# Patient Record
Sex: Male | Born: 1945 | Race: White | Hispanic: No | Smoking: Former smoker
Health system: Southern US, Community
[De-identification: ages and names within clinical notes are randomized; demographics above are authoritative.]

## PROBLEM LIST (undated history)

## (undated) DIAGNOSIS — K635 Polyp of colon: Secondary | ICD-10-CM

## (undated) DIAGNOSIS — J309 Allergic rhinitis, unspecified: Secondary | ICD-10-CM

## (undated) DIAGNOSIS — D126 Benign neoplasm of colon, unspecified: Secondary | ICD-10-CM

## (undated) DIAGNOSIS — C61 Malignant neoplasm of prostate: Secondary | ICD-10-CM

## (undated) DIAGNOSIS — IMO0002 Reserved for concepts with insufficient information to code with codable children: Secondary | ICD-10-CM

## (undated) DIAGNOSIS — K648 Other hemorrhoids: Secondary | ICD-10-CM

## (undated) DIAGNOSIS — E7800 Pure hypercholesterolemia, unspecified: Secondary | ICD-10-CM

## (undated) DIAGNOSIS — H269 Unspecified cataract: Secondary | ICD-10-CM

## (undated) DIAGNOSIS — Z85828 Personal history of other malignant neoplasm of skin: Secondary | ICD-10-CM

## (undated) DIAGNOSIS — Z8619 Personal history of other infectious and parasitic diseases: Secondary | ICD-10-CM

## (undated) DIAGNOSIS — K219 Gastro-esophageal reflux disease without esophagitis: Secondary | ICD-10-CM

## (undated) DIAGNOSIS — T7840XA Allergy, unspecified, initial encounter: Secondary | ICD-10-CM

## (undated) DIAGNOSIS — N189 Chronic kidney disease, unspecified: Secondary | ICD-10-CM

## (undated) DIAGNOSIS — K649 Unspecified hemorrhoids: Secondary | ICD-10-CM

## (undated) DIAGNOSIS — I10 Essential (primary) hypertension: Secondary | ICD-10-CM

## (undated) HISTORY — DX: Allergy, unspecified, initial encounter: T78.40XA

## (undated) HISTORY — DX: Unspecified hemorrhoids: K64.9

## (undated) HISTORY — DX: Chronic kidney disease, unspecified: N18.9

## (undated) HISTORY — DX: Pure hypercholesterolemia, unspecified: E78.00

## (undated) HISTORY — DX: Other hemorrhoids: K64.8

## (undated) HISTORY — DX: Reserved for concepts with insufficient information to code with codable children: IMO0002

## (undated) HISTORY — DX: Malignant neoplasm of prostate: C61

## (undated) HISTORY — PX: OTHER SURGICAL HISTORY: SHX169

## (undated) HISTORY — DX: Personal history of other malignant neoplasm of skin: Z85.828

## (undated) HISTORY — DX: Polyp of colon: K63.5

## (undated) HISTORY — DX: Allergic rhinitis, unspecified: J30.9

## (undated) HISTORY — DX: Personal history of other infectious and parasitic diseases: Z86.19

## (undated) HISTORY — DX: Gastro-esophageal reflux disease without esophagitis: K21.9

## (undated) HISTORY — PX: PROSTATE SURGERY: SHX751

## (undated) HISTORY — DX: Benign neoplasm of colon, unspecified: D12.6

## (undated) HISTORY — DX: Unspecified cataract: H26.9

## (undated) HISTORY — DX: Essential (primary) hypertension: I10

## (undated) HISTORY — PX: CATARACT EXTRACTION: SUR2

## (undated) HISTORY — PX: APPENDECTOMY: SHX54

## (undated) HISTORY — PX: SMALL INTESTINE SURGERY: SHX150

---

## 1998-11-28 ENCOUNTER — Ambulatory Visit (HOSPITAL_COMMUNITY): Admission: RE | Admit: 1998-11-28 | Discharge: 1998-11-28 | Payer: Self-pay | Admitting: Orthopedic Surgery

## 1998-11-28 ENCOUNTER — Encounter: Payer: Self-pay | Admitting: Orthopedic Surgery

## 1998-12-04 ENCOUNTER — Encounter: Payer: Self-pay | Admitting: Orthopedic Surgery

## 1998-12-04 ENCOUNTER — Ambulatory Visit (HOSPITAL_COMMUNITY): Admission: RE | Admit: 1998-12-04 | Discharge: 1998-12-04 | Payer: Self-pay | Admitting: Orthopedic Surgery

## 1999-09-17 ENCOUNTER — Encounter: Payer: Self-pay | Admitting: Family Medicine

## 1999-09-17 ENCOUNTER — Ambulatory Visit (HOSPITAL_COMMUNITY): Admission: RE | Admit: 1999-09-17 | Discharge: 1999-09-17 | Payer: Self-pay | Admitting: Family Medicine

## 1999-09-22 ENCOUNTER — Encounter: Admission: RE | Admit: 1999-09-22 | Discharge: 1999-09-22 | Payer: Self-pay | Admitting: Family Medicine

## 1999-09-22 ENCOUNTER — Encounter: Payer: Self-pay | Admitting: Family Medicine

## 1999-10-23 ENCOUNTER — Encounter: Payer: Self-pay | Admitting: Neurosurgery

## 1999-10-28 ENCOUNTER — Ambulatory Visit (HOSPITAL_COMMUNITY): Admission: RE | Admit: 1999-10-28 | Discharge: 1999-10-29 | Payer: Self-pay | Admitting: Neurosurgery

## 1999-10-28 ENCOUNTER — Encounter: Payer: Self-pay | Admitting: Neurosurgery

## 1999-12-15 ENCOUNTER — Ambulatory Visit (HOSPITAL_COMMUNITY): Admission: RE | Admit: 1999-12-15 | Discharge: 1999-12-15 | Payer: Self-pay | Admitting: Neurosurgery

## 1999-12-15 ENCOUNTER — Encounter: Payer: Self-pay | Admitting: Neurosurgery

## 2000-02-23 ENCOUNTER — Encounter: Payer: Self-pay | Admitting: Family Medicine

## 2000-02-23 ENCOUNTER — Ambulatory Visit (HOSPITAL_COMMUNITY): Admission: RE | Admit: 2000-02-23 | Discharge: 2000-02-23 | Payer: Self-pay | Admitting: Family Medicine

## 2001-02-09 HISTORY — PX: OTHER SURGICAL HISTORY: SHX169

## 2001-12-19 ENCOUNTER — Ambulatory Visit (HOSPITAL_COMMUNITY): Admission: RE | Admit: 2001-12-19 | Discharge: 2001-12-19 | Payer: Self-pay | Admitting: *Deleted

## 2002-12-13 ENCOUNTER — Emergency Department (HOSPITAL_COMMUNITY): Admission: EM | Admit: 2002-12-13 | Discharge: 2002-12-13 | Payer: Self-pay | Admitting: Emergency Medicine

## 2004-03-11 ENCOUNTER — Ambulatory Visit (HOSPITAL_COMMUNITY): Admission: RE | Admit: 2004-03-11 | Discharge: 2004-03-11 | Payer: Self-pay | Admitting: Family Medicine

## 2004-03-11 IMAGING — CR DG CHEST 2V
2 series · 2 of 2 positions shown · non-contrast
Comparison: none

CLINICAL DATA: Chronic cough for three weeks.
 TWO VIEW CHEST: 
 PA and lateral views of the chest are made and are compared to the previous studies of [DATE] and again show the small 1 mm calcified granuloma in the left upper lung field.  There is mild diffuse peribronchial thickening but no evidence of definite acute infiltrate, consolidation, pleural effusion, or pneumothorax.  There has been an anterior fusion of the lower cervical spine.  The heart and mediastinum are normal.  The bony thorax is within the limits of normal.

[view not recorded (1 of 2)]
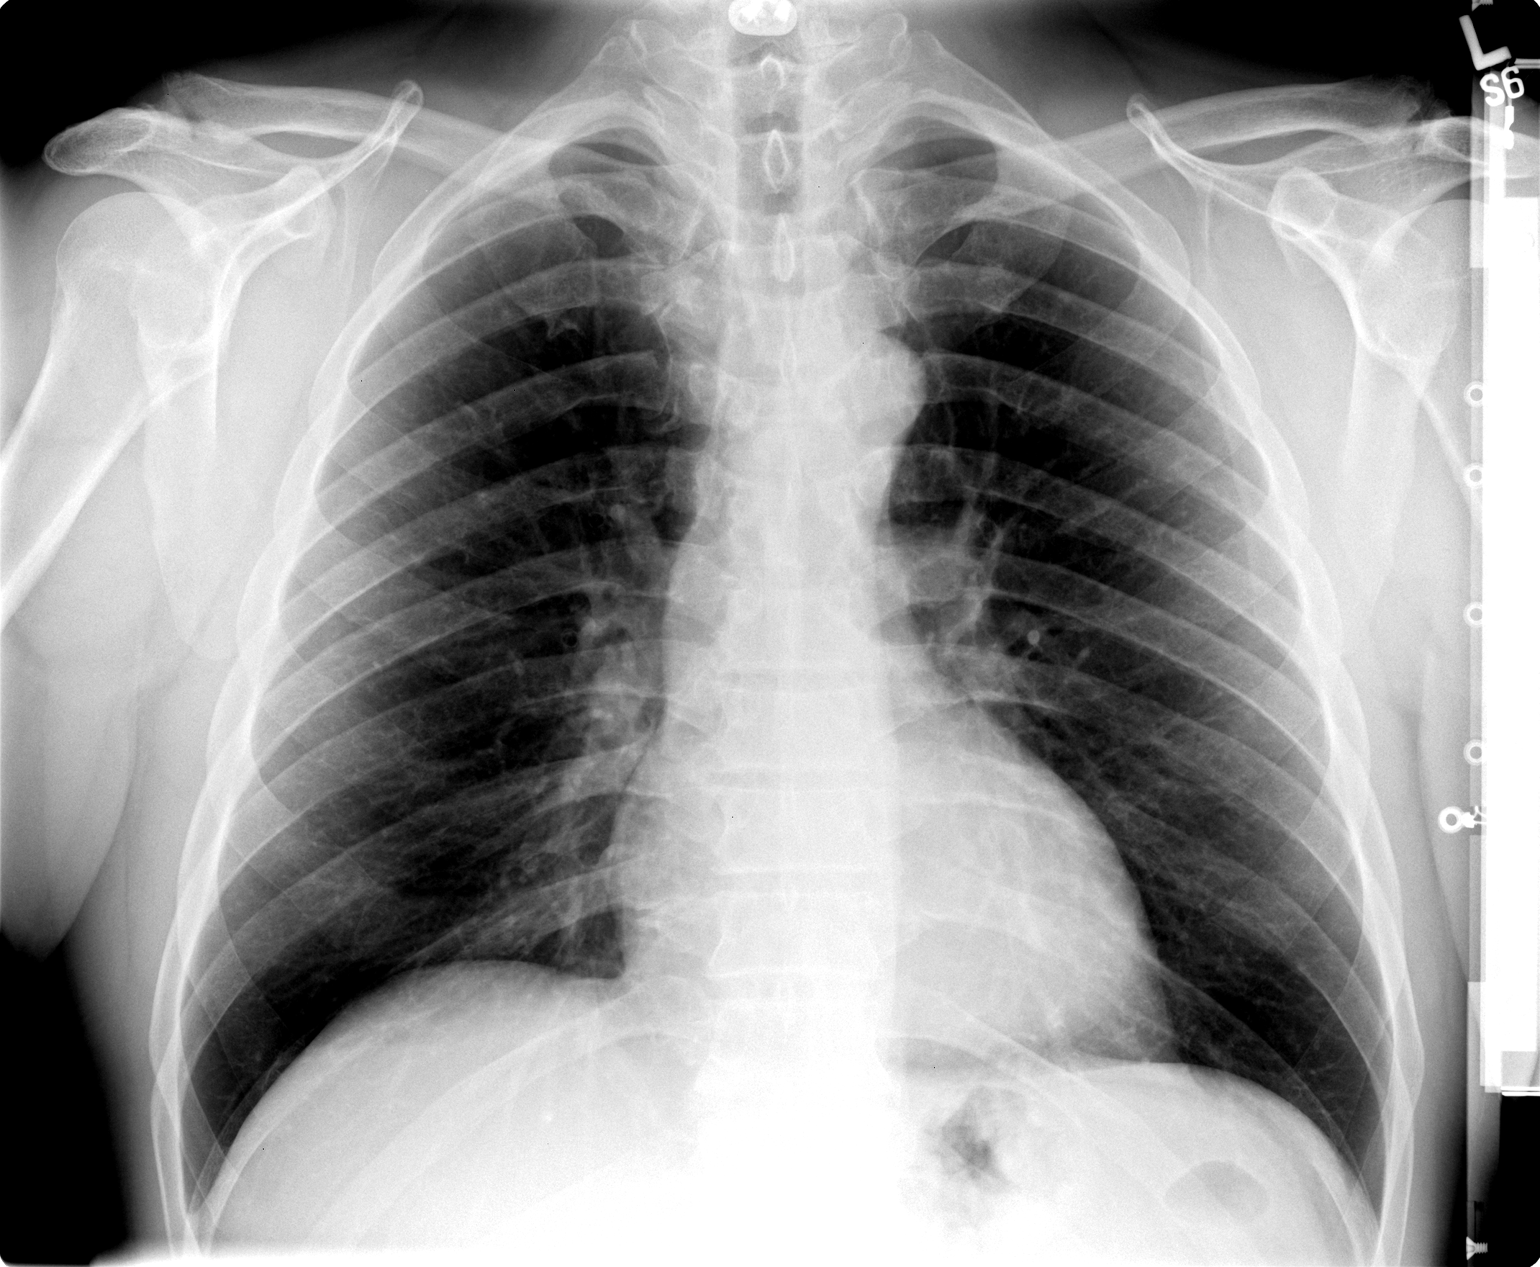

[view not recorded (2 of 2)]
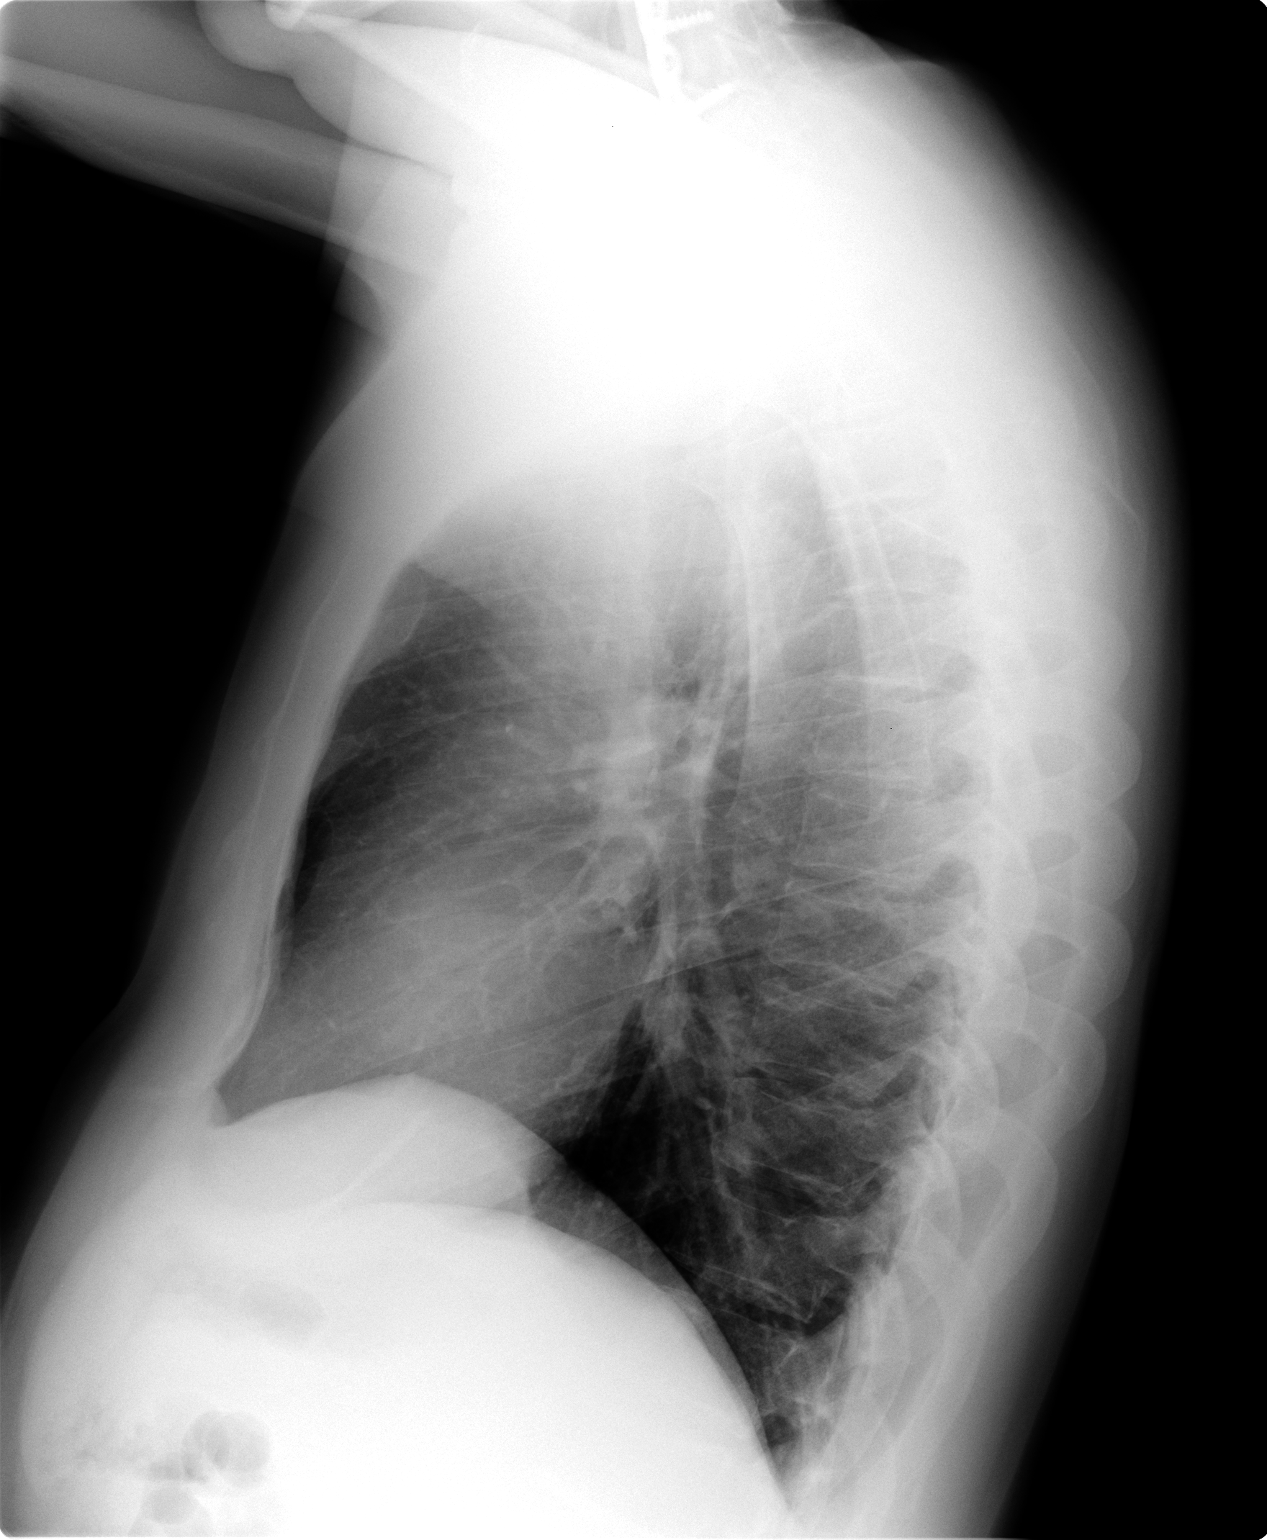

[2 of 2 positions shown; findings below may reference images not displayed]

IMPRESSION: Mild diffuse peribronchial thickening compatible with a mild bronchitis.  
 Stable 1 mm calcified granuloma in the left upper lobe.

## 2007-04-29 IMAGING — CR DG HAND*L* EXAM
1 series · 3 of 3 positions shown · non-contrast
Comparison: NONE

CLINICAL DATA: Pain and swelling. 

LEFT HAND

[Series 1: view not recorded · 0.17mm/px · 3 of 3 slices shown]
[im 1/3]
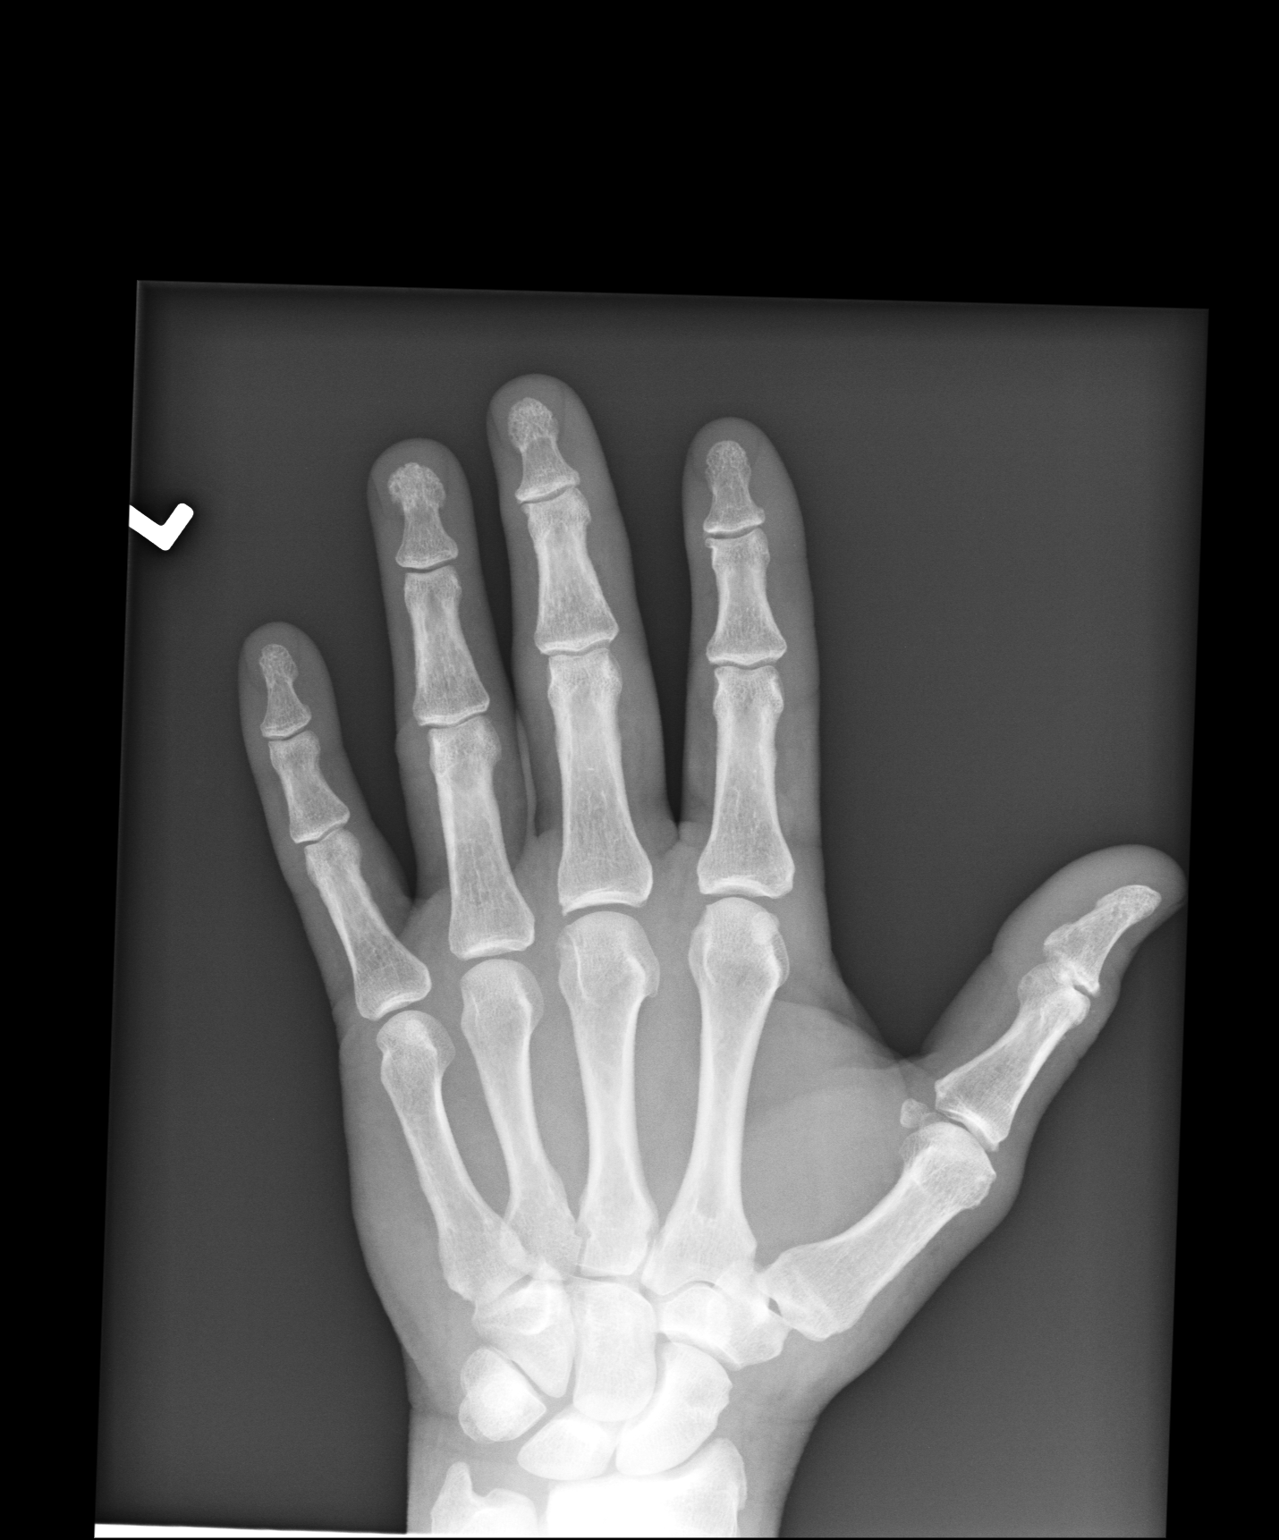
[im 2/3]
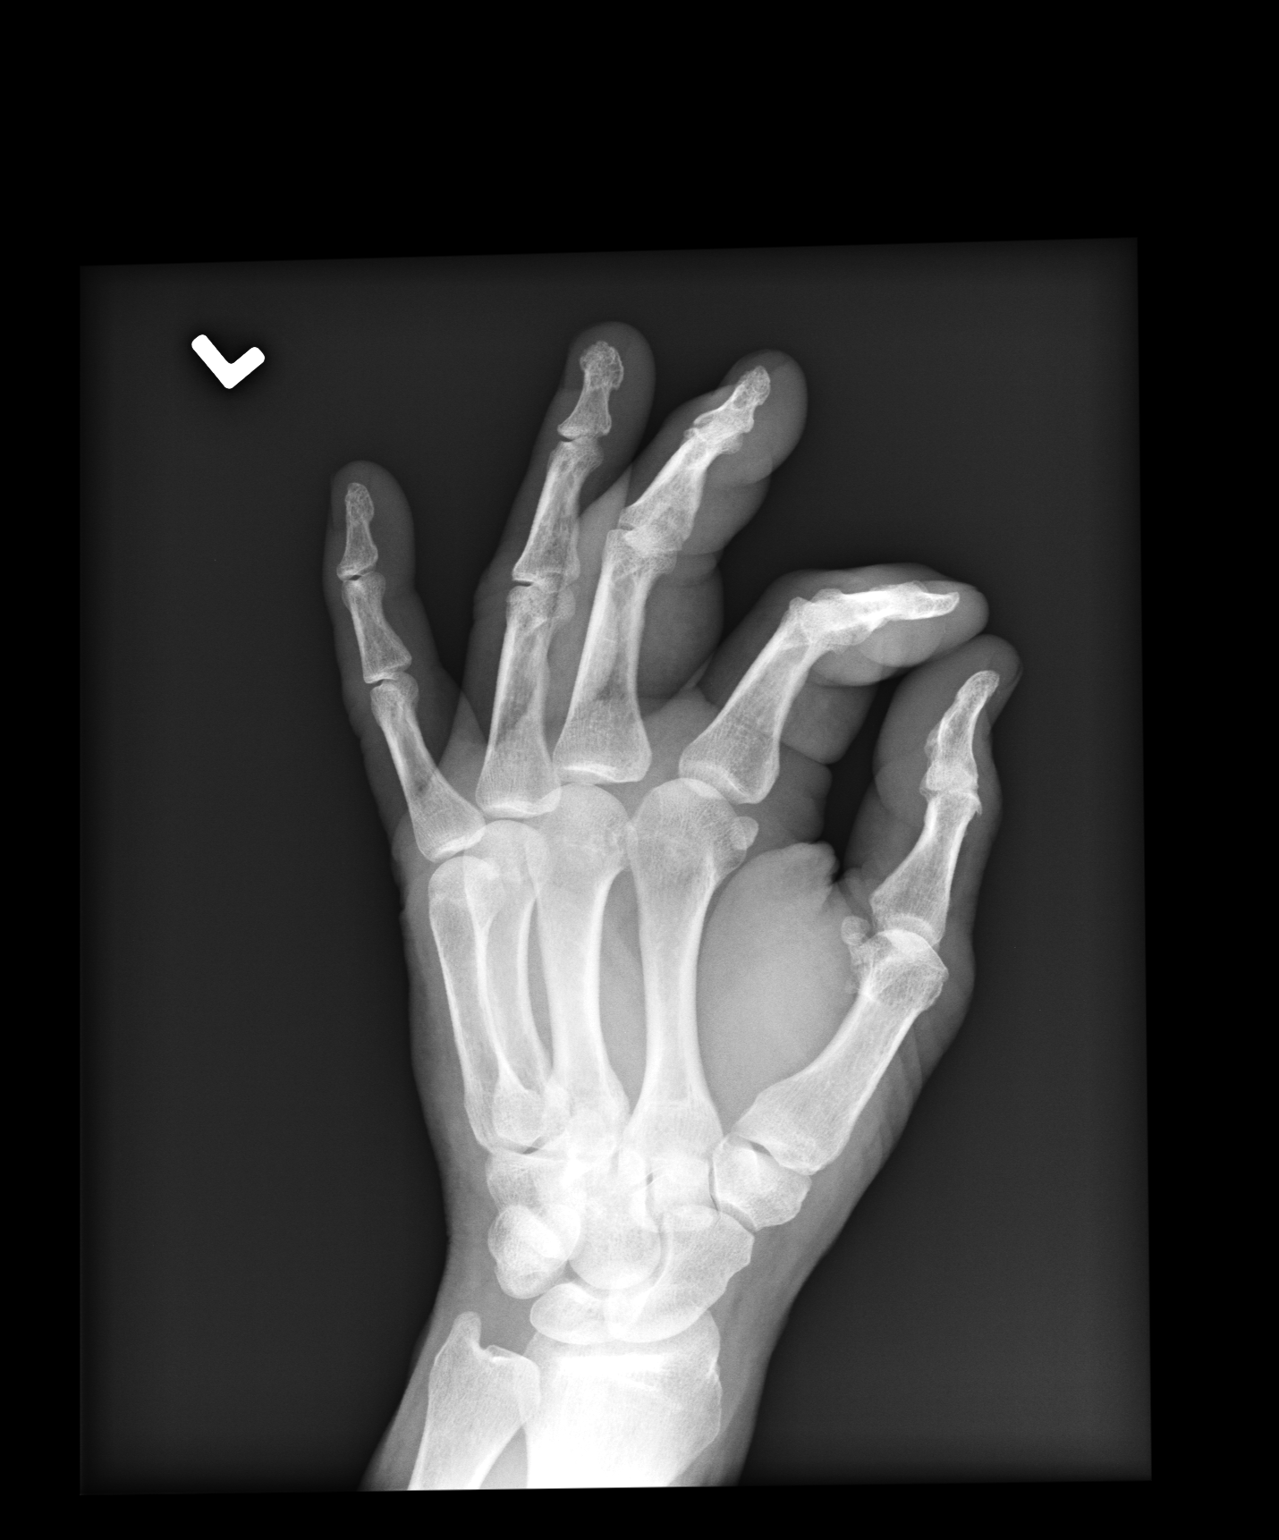
[im 3/3]
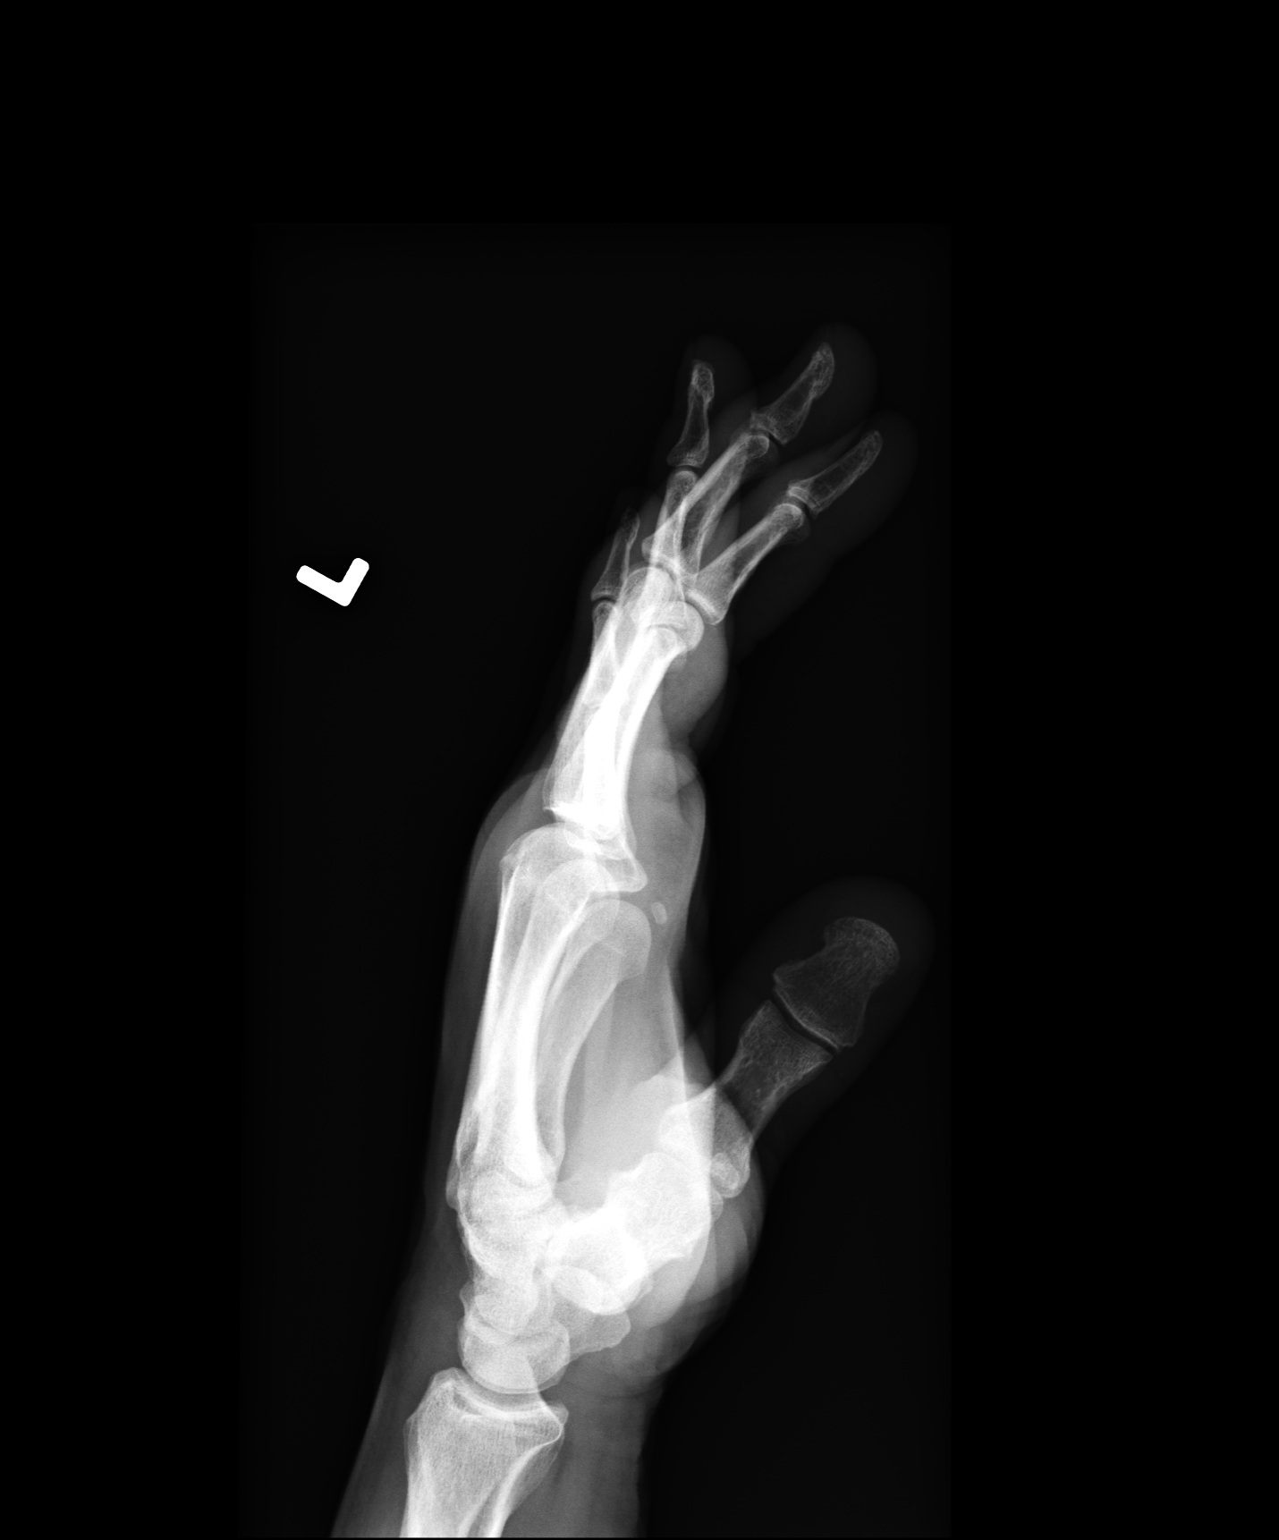

[3 of 3 positions shown; findings below may reference images not displayed]

FINDINGS: Views of the left hand demonstrate no evidence of 
fracture, dislocation, soft tissue abnormality or changes 
suggesting erosive or degenerative arthritis. 

reviewed on [DATE] Dict Date: [DATE]  Tran Date:  
[DATE] DAS  [REDACTED]

## 2011-02-16 DIAGNOSIS — C61 Malignant neoplasm of prostate: Secondary | ICD-10-CM | POA: Diagnosis not present

## 2011-02-17 DIAGNOSIS — M6281 Muscle weakness (generalized): Secondary | ICD-10-CM | POA: Diagnosis not present

## 2011-02-17 DIAGNOSIS — N393 Stress incontinence (female) (male): Secondary | ICD-10-CM | POA: Diagnosis not present

## 2011-02-17 DIAGNOSIS — C61 Malignant neoplasm of prostate: Secondary | ICD-10-CM | POA: Diagnosis not present

## 2011-05-18 DIAGNOSIS — E782 Mixed hyperlipidemia: Secondary | ICD-10-CM | POA: Diagnosis not present

## 2011-05-18 DIAGNOSIS — J309 Allergic rhinitis, unspecified: Secondary | ICD-10-CM | POA: Diagnosis not present

## 2011-05-18 DIAGNOSIS — C61 Malignant neoplasm of prostate: Secondary | ICD-10-CM | POA: Diagnosis not present

## 2011-05-18 DIAGNOSIS — I10 Essential (primary) hypertension: Secondary | ICD-10-CM | POA: Diagnosis not present

## 2011-05-25 DIAGNOSIS — C61 Malignant neoplasm of prostate: Secondary | ICD-10-CM | POA: Diagnosis not present

## 2011-05-25 DIAGNOSIS — N529 Male erectile dysfunction, unspecified: Secondary | ICD-10-CM | POA: Diagnosis not present

## 2011-05-26 DIAGNOSIS — E782 Mixed hyperlipidemia: Secondary | ICD-10-CM | POA: Diagnosis not present

## 2011-05-26 DIAGNOSIS — C61 Malignant neoplasm of prostate: Secondary | ICD-10-CM | POA: Diagnosis not present

## 2011-05-26 DIAGNOSIS — I10 Essential (primary) hypertension: Secondary | ICD-10-CM | POA: Diagnosis not present

## 2011-05-26 DIAGNOSIS — K219 Gastro-esophageal reflux disease without esophagitis: Secondary | ICD-10-CM | POA: Diagnosis not present

## 2011-05-26 DIAGNOSIS — J309 Allergic rhinitis, unspecified: Secondary | ICD-10-CM | POA: Diagnosis not present

## 2011-05-26 DIAGNOSIS — R0789 Other chest pain: Secondary | ICD-10-CM | POA: Diagnosis not present

## 2011-06-08 DIAGNOSIS — K219 Gastro-esophageal reflux disease without esophagitis: Secondary | ICD-10-CM | POA: Diagnosis not present

## 2011-06-08 DIAGNOSIS — R079 Chest pain, unspecified: Secondary | ICD-10-CM | POA: Diagnosis not present

## 2011-06-08 DIAGNOSIS — I10 Essential (primary) hypertension: Secondary | ICD-10-CM | POA: Diagnosis not present

## 2011-06-22 DIAGNOSIS — E782 Mixed hyperlipidemia: Secondary | ICD-10-CM | POA: Diagnosis not present

## 2011-06-22 DIAGNOSIS — R079 Chest pain, unspecified: Secondary | ICD-10-CM | POA: Diagnosis not present

## 2011-06-22 DIAGNOSIS — I10 Essential (primary) hypertension: Secondary | ICD-10-CM | POA: Diagnosis not present

## 2011-06-26 DIAGNOSIS — I10 Essential (primary) hypertension: Secondary | ICD-10-CM | POA: Diagnosis not present

## 2011-06-26 DIAGNOSIS — R079 Chest pain, unspecified: Secondary | ICD-10-CM | POA: Diagnosis not present

## 2011-06-26 DIAGNOSIS — E782 Mixed hyperlipidemia: Secondary | ICD-10-CM | POA: Diagnosis not present

## 2011-07-17 DIAGNOSIS — C61 Malignant neoplasm of prostate: Secondary | ICD-10-CM | POA: Diagnosis not present

## 2011-07-17 DIAGNOSIS — G4700 Insomnia, unspecified: Secondary | ICD-10-CM | POA: Diagnosis not present

## 2011-07-17 DIAGNOSIS — I10 Essential (primary) hypertension: Secondary | ICD-10-CM | POA: Diagnosis not present

## 2011-07-17 DIAGNOSIS — K219 Gastro-esophageal reflux disease without esophagitis: Secondary | ICD-10-CM | POA: Diagnosis not present

## 2011-07-17 DIAGNOSIS — J309 Allergic rhinitis, unspecified: Secondary | ICD-10-CM | POA: Diagnosis not present

## 2011-07-17 DIAGNOSIS — E782 Mixed hyperlipidemia: Secondary | ICD-10-CM | POA: Diagnosis not present

## 2011-08-24 DIAGNOSIS — Z125 Encounter for screening for malignant neoplasm of prostate: Secondary | ICD-10-CM | POA: Diagnosis not present

## 2011-08-24 DIAGNOSIS — Z79899 Other long term (current) drug therapy: Secondary | ICD-10-CM | POA: Diagnosis not present

## 2011-08-24 DIAGNOSIS — Z8546 Personal history of malignant neoplasm of prostate: Secondary | ICD-10-CM | POA: Diagnosis not present

## 2011-11-17 DIAGNOSIS — E782 Mixed hyperlipidemia: Secondary | ICD-10-CM | POA: Diagnosis not present

## 2011-11-17 DIAGNOSIS — I10 Essential (primary) hypertension: Secondary | ICD-10-CM | POA: Diagnosis not present

## 2011-11-17 DIAGNOSIS — J309 Allergic rhinitis, unspecified: Secondary | ICD-10-CM | POA: Diagnosis not present

## 2011-11-17 DIAGNOSIS — K219 Gastro-esophageal reflux disease without esophagitis: Secondary | ICD-10-CM | POA: Diagnosis not present

## 2011-11-17 DIAGNOSIS — G4700 Insomnia, unspecified: Secondary | ICD-10-CM | POA: Diagnosis not present

## 2011-11-17 DIAGNOSIS — C61 Malignant neoplasm of prostate: Secondary | ICD-10-CM | POA: Diagnosis not present

## 2011-11-17 DIAGNOSIS — Z23 Encounter for immunization: Secondary | ICD-10-CM | POA: Diagnosis not present

## 2011-11-23 DIAGNOSIS — C61 Malignant neoplasm of prostate: Secondary | ICD-10-CM | POA: Diagnosis not present

## 2011-11-25 ENCOUNTER — Telehealth: Payer: Self-pay | Admitting: Internal Medicine

## 2011-11-25 NOTE — Telephone Encounter (Signed)
Forward 73 pages from [facility] to Dr. Erick Blinks for review on 11-25-11 ym

## 2011-11-26 ENCOUNTER — Encounter: Payer: Self-pay | Admitting: Internal Medicine

## 2011-12-21 ENCOUNTER — Encounter: Payer: Self-pay | Admitting: Internal Medicine

## 2011-12-22 ENCOUNTER — Ambulatory Visit (INDEPENDENT_AMBULATORY_CARE_PROVIDER_SITE_OTHER): Payer: Medicare Other | Admitting: Internal Medicine

## 2011-12-22 ENCOUNTER — Encounter: Payer: Self-pay | Admitting: Internal Medicine

## 2011-12-22 ENCOUNTER — Ambulatory Visit: Payer: Self-pay | Admitting: Internal Medicine

## 2011-12-22 VITALS — BP 144/90 | HR 76 | Ht 68.5 in | Wt 200.8 lb

## 2011-12-22 DIAGNOSIS — K219 Gastro-esophageal reflux disease without esophagitis: Secondary | ICD-10-CM | POA: Diagnosis not present

## 2011-12-22 DIAGNOSIS — K648 Other hemorrhoids: Secondary | ICD-10-CM

## 2011-12-22 DIAGNOSIS — K3189 Other diseases of stomach and duodenum: Secondary | ICD-10-CM

## 2011-12-22 DIAGNOSIS — C61 Malignant neoplasm of prostate: Secondary | ICD-10-CM | POA: Insufficient documentation

## 2011-12-22 DIAGNOSIS — I10 Essential (primary) hypertension: Secondary | ICD-10-CM | POA: Insufficient documentation

## 2011-12-22 DIAGNOSIS — R140 Abdominal distension (gaseous): Secondary | ICD-10-CM

## 2011-12-22 DIAGNOSIS — R141 Gas pain: Secondary | ICD-10-CM | POA: Diagnosis not present

## 2011-12-22 DIAGNOSIS — Z86010 Personal history of colonic polyps: Secondary | ICD-10-CM | POA: Insufficient documentation

## 2011-12-22 DIAGNOSIS — R143 Flatulence: Secondary | ICD-10-CM | POA: Diagnosis not present

## 2011-12-22 DIAGNOSIS — R1013 Epigastric pain: Secondary | ICD-10-CM

## 2011-12-22 MED ORDER — ALIGN PO CAPS: 1.0000 | ORAL_CAPSULE | Freq: Every day | ORAL | Status: DC

## 2011-12-22 MED ORDER — HYDROCORTISONE ACETATE 25 MG RE SUPP: 25.0000 mg | Freq: Two times a day (BID) | RECTAL | Status: DC

## 2011-12-22 NOTE — Progress Notes (Signed)
Patient ID: Kurt Hall, male   DOB: 05-Mar-1945, 66 y.o.   MRN: [id number]  SUBJECTIVE: HPI Kurt Hall is a 66 yo male with past no history of hypertension, hypercholesterolemia, adenomatous colon polyps, prostate cancer followed by [facility] urology, and small bowel obstruction status post lysis of adhesions who seen in consultation at the request of Dr. Azucena Cecil for evaluation of indigestion and abdominal bloating. The patient reports long-standing issues with heartburn and indigestion. He has been on PPI therapy for some time, and several months ago was switched to Dexilant therapy. He is taking Dexilant 60 mg 30 minutes before breakfast. This has relieved his heartburn but he continues to have indigestion and mild nausea after eating. He denies vomiting. He denies early satiety. No dysphagia or odynophagia. He did report a history of constipation, but this resolved after his abdominal surgery in may 2012. He reports in may of 2012 while traveling in [place] he developed abdominal pain and a bowel obstruction. He was taken to surgery and had lysis of adhesions. Since this time his constipation has not been an issue. He is having regular, formed brown bowel movements 1-2 times daily. No blood in his stool or melena. He also reports 7-8 months of abdominal bloating which tends to be worse at night. He has tried a very his diet without relief. He also has tried Gas-X which seems to help some but not completely. The bloating is relieved with passage of lower gas, but he denies frequent belching.  No fevers, chills.  Dr. Bosie Clos performed the patient's colonoscopy on 05/08/2010. This exam was to the cecum with a good preparation. Findings 3 semisolid so polyps in the rectosigmoid colon and distal sigmoid colon. Polyps removed with cold forceps. Internal hemorrhoids. Pathology =  tubular adenoma, separate benign polypoid colorectal mucosa with lymphoid aggregate, separate fragments of benign polypoid colorectal mucosa.  High-grade dysplasia is not identified  Review of Systems  As per history of present illness, otherwise negative   Past Medical History  Diagnosis Date  . Hypertension   . Hypercholesterolemia   . Allergic rhinitis   . Hx of herpes genitalis   . Hx of basal cell carcinoma   . Dactylitis   . Colon polyps   . Prostate cancer     followed by [facility]    Current Outpatient Prescriptions  Medication Sig Dispense Refill  . amLODipine (NORVASC) 5 MG tablet Take 5 mg by mouth daily.      Marland Kitchen aspirin 81 MG tablet Take 81 mg by mouth daily.      Marland Kitchen atorvastatin (LIPITOR) 20 MG tablet Take 20 mg by mouth daily.      . clonazePAM (KLONOPIN) 0.5 MG tablet Take 0.5 mg by mouth 2 (two) times daily as needed.      Marland Kitchen dexlansoprazole (DEXILANT) 60 MG capsule Take 60 mg by mouth daily.      . fluticasone (VERAMYST) 27.5 MCG/SPRAY nasal spray Place 2 sprays into the nose daily.      . folic acid (FOLVITE) 400 MCG tablet Take 400 mcg by mouth daily.      Marland Kitchen losartan-hydrochlorothiazide (HYZAAR) 100-12.5 MG per tablet Take 1 tablet by mouth daily.      . Multiple Vitamins-Minerals (MULTIVITAMIN PO) Take by mouth daily.      . bifidobacterium infantis (ALIGN) capsule Take 1 capsule by mouth daily.  14 capsule  0  . hydrocortisone (ANUSOL-HC) 25 MG suppository Place 1 suppository (25 mg total) rectally 2 (two) times daily.  12  suppository  0    No Known Allergies  Family History  Problem Relation Age of Onset  . Colon cancer Mother     History  Substance Use Topics  . Smoking status: Former Smoker    Types: Cigarettes  . Smokeless tobacco: Never Used  . Alcohol Use: Yes    OBJECTIVE: BP 144/90  Pulse 76  Ht 5' 8.5" (1.74 m)  Wt 200 lb 12.8 oz (91.082 kg)  BMI 30.09 kg/m2 Constitutional: Well-developed and well-nourished. No distress. HEENT: Normocephalic and atraumatic. Oropharynx is clear and moist. No oropharyngeal exudate. Conjunctivae are normal. No scleral icterus. Neck: Neck  supple. Trachea midline. Cardiovascular: Normal rate, regular rhythm and intact distal pulses. No M/R/G Pulmonary/chest: Effort normal and breath sounds normal. No wheezing, rales or rhonchi. Abdominal: Soft, nontender, nondistended. Bowel sounds active throughout. Well-healed midline abdominal scar There are no masses palpable. No hepatosplenomegaly. Extremities: no clubbing, cyanosis, or edema Lymphadenopathy: No cervical adenopathy noted. Neurological: Alert and oriented to person place and time. Skin: Skin is warm and dry. No rashes noted. Psychiatric: Normal mood and affect. Behavior is normal.   ASSESSMENT AND PLAN: 66 yo male with past no history of hypertension, hypercholesterolemia, adenomatous colon polyps, prostate cancer followed by [facility] urology, and small bowel obstruction status post lysis of adhesions who seen in consultation at the request of Dr. Azucena Cecil for evaluation of indigestion and abdominal bloating  1.  Indigestion/bloating -- given the patient's ongoing dyspeptic symptoms despite daily PPI therapy, I recommended direct visualization with upper endoscopy. I also will give him a trial Align one capsule daily as a probiotic to try to help regulate her digestion and help with bloating. He is given a diet sheet on low bloating foods.  Small intestinal bacterial overgrowth is in the differential, especially given his prior bowel surgery. If his upper endoscopy is unremarkable, I will consider a trial of a nonabsorbable antibiotic such as rifaximin for empiric treatment.  I will not changed his PPI therapy until after endoscopy based on results.  2.  Hemorrhoids -- intermittently an issue and seen on colonoscopy in 2012. I will give him a prescription for hydrocortisone suppositories to be used twice daily for 5 days as needed  3.  CRC screening/history of tubular adenoma -- I recommend repeat colonoscopy in 2017 based on the findings/pathology report of one tubular adenoma (the  other 2 polyps were not adenomatous tissue)

## 2011-12-22 NOTE — Patient Instructions (Addendum)
You have been scheduled for an endoscopy with propofol. Please follow written instructions given to you at your visit today. If you use inhalers (even only as needed) or a CPAP machine, please bring them with you on the day of your procedure.  You have been given samples of Align, please take 1 capsule daily.

## 2011-12-28 DIAGNOSIS — H04129 Dry eye syndrome of unspecified lacrimal gland: Secondary | ICD-10-CM | POA: Diagnosis not present

## 2011-12-28 DIAGNOSIS — H2510 Age-related nuclear cataract, unspecified eye: Secondary | ICD-10-CM | POA: Diagnosis not present

## 2011-12-30 ENCOUNTER — Ambulatory Visit (AMBULATORY_SURGERY_CENTER): Payer: Medicare Other | Admitting: Internal Medicine

## 2011-12-30 ENCOUNTER — Encounter: Payer: Self-pay | Admitting: Internal Medicine

## 2011-12-30 VITALS — BP 126/70 | HR 67 | Temp 97.4°F | Resp 20 | Ht 68.0 in | Wt 200.0 lb

## 2011-12-30 DIAGNOSIS — R1013 Epigastric pain: Secondary | ICD-10-CM | POA: Diagnosis not present

## 2011-12-30 DIAGNOSIS — K3189 Other diseases of stomach and duodenum: Secondary | ICD-10-CM | POA: Diagnosis not present

## 2011-12-30 DIAGNOSIS — K219 Gastro-esophageal reflux disease without esophagitis: Secondary | ICD-10-CM | POA: Diagnosis not present

## 2011-12-30 DIAGNOSIS — K317 Polyp of stomach and duodenum: Secondary | ICD-10-CM

## 2011-12-30 DIAGNOSIS — K319 Disease of stomach and duodenum, unspecified: Secondary | ICD-10-CM

## 2011-12-30 DIAGNOSIS — R143 Flatulence: Secondary | ICD-10-CM | POA: Diagnosis not present

## 2011-12-30 DIAGNOSIS — D1330 Benign neoplasm of unspecified part of small intestine: Secondary | ICD-10-CM | POA: Diagnosis not present

## 2011-12-30 DIAGNOSIS — R141 Gas pain: Secondary | ICD-10-CM

## 2011-12-30 DIAGNOSIS — R1310 Dysphagia, unspecified: Secondary | ICD-10-CM | POA: Diagnosis not present

## 2011-12-30 MED ORDER — SODIUM CHLORIDE 0.9 % IV SOLN: 500.0000 mL | INTRAVENOUS | Status: DC

## 2011-12-30 NOTE — Progress Notes (Signed)
Propofol given over incremental dosages 

## 2011-12-30 NOTE — Patient Instructions (Addendum)
YOU HAD AN ENDOSCOPIC PROCEDURE TODAY AT THE [facility]: Refer to the procedure report that was given to you for any specific questions about what was found during the examination.  If the procedure report does not answer your questions, please call your gastroenterologist to clarify.  If you requested that your care partner not be given the details of your procedure findings, then the procedure report has been included in a sealed envelope for you to review at your convenience later.  YOU SHOULD EXPECT: Some feelings of bloating in the abdomen. Passage of more gas than usual.  Walking can help get rid of the air that was put into your GI tract during the procedure and reduce the bloating. If you had a lower endoscopy (such as a colonoscopy or flexible sigmoidoscopy) you may notice spotting of blood in your stool or on the toilet paper. If you underwent a bowel prep for your procedure, then you may not have a normal bowel movement for a few days.  DIET: Your first meal following the procedure should be a light meal and then it is ok to progress to your normal diet.  A half-sandwich or bowl of soup is an example of a good first meal.  Heavy or fried foods are harder to digest and may make you feel nauseous or bloated.  Likewise meals heavy in dairy and vegetables can cause extra gas to form and this can also increase the bloating.  Drink plenty of fluids but you should avoid alcoholic beverages for 24 hours.  ACTIVITY: Your care partner should take you home directly after the procedure.  You should plan to take it easy, moving slowly for the rest of the day.  You can resume normal activity the day after the procedure however you should NOT DRIVE or use heavy machinery for 24 hours (because of the sedation medicines used during the test).    SYMPTOMS TO REPORT IMMEDIATELY: A gastroenterologist can be reached at any hour.  During normal business hours, 8:30 AM to 5:00 PM Monday through Friday,  call [phone].  After hours and on weekends, please call the GI answering service at [phone] who will take a message and have the physician on call contact you.   Following upper endoscopy (EGD)  Vomiting of blood or coffee ground material  New chest pain or pain under the shoulder blades  Painful or persistently difficult swallowing  New shortness of breath  Fever of 100F or higher  Black, tarry-looking stools  FOLLOW UP: If any biopsies were taken you will be contacted by phone or by letter within the next 1-3 weeks.  Call your gastroenterologist if you have not heard about the biopsies in 3 weeks.  Our staff will call the home number listed on your records the next business day following your procedure to check on you and address any questions or concerns that you may have at that time regarding the information given to you following your procedure. This is a courtesy call and so if there is no answer at the home number and we have not heard from you through the emergency physician on call, we will assume that you have returned to your regular daily activities without incident.  SIGNATURES/CONFIDENTIALITY: You and/or your care partner have signed paperwork which will be entered into your electronic medical record.  These signatures attest to the fact that that the information above on your After Visit Summary has been reviewed and is understood.  Full responsibility   of the confidentiality of this discharge information lies with you and/or your care-partner.  Resume medications. 

## 2011-12-30 NOTE — Progress Notes (Signed)
The pt tolerated the egd very well. Maw   

## 2011-12-30 NOTE — Op Note (Signed)
[facility] [address] [place], [address]   ENDOSCOPY PROCEDURE REPORT  PATIENT: Kurt Hall, Kurt Hall  MR#: [id number] BIRTHDATE: Jun 11, 1945 , 66  yrs. old GENDER: Male ENDOSCOPIST: Beverley Fiedler, MD REFERRED BY:  Tally Joe PROCEDURE DATE:  12/30/2011 PROCEDURE:  EGD w/ biopsy ASA CLASS:     Class II INDICATIONS:  Dyspepsia.   abdominal bloating. MEDICATIONS: MAC sedation, administered by CRNA and propofol (Diprivan) 250mg  IV TOPICAL ANESTHETIC: Cetacaine Spray  DESCRIPTION OF PROCEDURE: After the risks benefits and alternatives of the procedure were thoroughly explained, informed consent was obtained.  The LB [id number] endoscope was introduced through the mouth and advanced to the second portion of the duodenum. Without limitations.  The instrument was slowly withdrawn as the mucosa was fully examined.     ESOPHAGUS: The mucosa of the esophagus appeared normal.   A normal Z-line was observed 43 cm from the incisors.  STOMACH: Small nodule was found in the gastric antrum.  Multiple biopsies were performed using cold forceps.   The stomach otherwise appeared normal.  DUODENUM: Two small sessile polyps (query Brunner's gland hyperplasia), measuring 3-4 mm in size, were found in the duodenal bulb.   The duodenal mucosa showed no abnormalities in the bulb and second portion of the duodenum.  Retroflexed views revealed no abnormalities.     T The scope was then withdrawn from the patient and the procedure completed.  COMPLICATIONS: There were no complications.  ENDOSCOPIC IMPRESSION: 1.   The mucosa of the esophagus appeared normal 2.   Normal Z-line was observed 43 cm from the incisors 3.   Nodule was found in the gastric antrum; multiple biopsies 4.   The stomach otherwise appeared normal 5.   Two small sessile polyps, measuring 3-4 mm in size, were found in the duodenal bulb 6.   The duodenal mucosa showed no abnormalities in the bulb  and second portion of the duodenum    RECOMMENDATIONS: 1.  Await biopsy results 2.  Continue current medications 3.  Office follow-up as needed  eSigned:  Beverley Fiedler, MD 12/30/2011 12:01 PM   CC:The Patient and Tally Joe, MD  PATIENT NAME:  [name], [name] MR#: [id number]

## 2011-12-30 NOTE — Progress Notes (Signed)
Patient did not experience any of the following events: a burn prior to discharge; a fall within the facility; wrong site/side/patient/procedure/implant event; or a hospital transfer or hospital admission upon discharge from the facility. (G8907) Patient did not have preoperative order for IV antibiotic SSI prophylaxis. (G8918)  

## 2011-12-31 ENCOUNTER — Telehealth: Payer: Self-pay | Admitting: *Deleted

## 2011-12-31 NOTE — Telephone Encounter (Signed)
  Follow up Call-  Call back number 12/30/2011  Post procedure Call Back phone  # [phone]- or cell [phone]  Permission to leave phone message Yes     Patient questions:  Do you have a fever, pain , or abdominal swelling? no Pain Score  0 *  Have you tolerated food without any problems? yes  Have you been able to return to your normal activities? yes  Do you have any questions about your discharge instructions: Diet   no Medications  no Follow up visit  no  Do you have questions or concerns about your Care? no  Actions: * If pain score is 4 or above: No action needed, pain <4.

## 2012-01-04 ENCOUNTER — Encounter: Payer: Self-pay | Admitting: Internal Medicine

## 2012-02-22 DIAGNOSIS — C61 Malignant neoplasm of prostate: Secondary | ICD-10-CM | POA: Diagnosis not present

## 2012-03-07 DIAGNOSIS — C61 Malignant neoplasm of prostate: Secondary | ICD-10-CM | POA: Diagnosis not present

## 2012-03-16 DIAGNOSIS — Z87891 Personal history of nicotine dependence: Secondary | ICD-10-CM | POA: Diagnosis not present

## 2012-03-16 DIAGNOSIS — Z510 Encounter for antineoplastic radiation therapy: Secondary | ICD-10-CM | POA: Diagnosis not present

## 2012-03-16 DIAGNOSIS — C61 Malignant neoplasm of prostate: Secondary | ICD-10-CM | POA: Diagnosis not present

## 2012-03-16 DIAGNOSIS — I10 Essential (primary) hypertension: Secondary | ICD-10-CM | POA: Diagnosis not present

## 2012-03-16 DIAGNOSIS — Z808 Family history of malignant neoplasm of other organs or systems: Secondary | ICD-10-CM | POA: Diagnosis not present

## 2012-03-16 DIAGNOSIS — E7800 Pure hypercholesterolemia, unspecified: Secondary | ICD-10-CM | POA: Diagnosis not present

## 2012-03-28 DIAGNOSIS — N3289 Other specified disorders of bladder: Secondary | ICD-10-CM | POA: Diagnosis not present

## 2012-03-28 DIAGNOSIS — C61 Malignant neoplasm of prostate: Secondary | ICD-10-CM | POA: Diagnosis not present

## 2012-03-28 DIAGNOSIS — Z9889 Other specified postprocedural states: Secondary | ICD-10-CM | POA: Diagnosis not present

## 2012-03-30 DIAGNOSIS — I10 Essential (primary) hypertension: Secondary | ICD-10-CM | POA: Diagnosis not present

## 2012-03-30 DIAGNOSIS — Z510 Encounter for antineoplastic radiation therapy: Secondary | ICD-10-CM | POA: Diagnosis not present

## 2012-03-30 DIAGNOSIS — E7800 Pure hypercholesterolemia, unspecified: Secondary | ICD-10-CM | POA: Diagnosis not present

## 2012-03-30 DIAGNOSIS — C61 Malignant neoplasm of prostate: Secondary | ICD-10-CM | POA: Diagnosis not present

## 2012-04-08 DIAGNOSIS — C61 Malignant neoplasm of prostate: Secondary | ICD-10-CM | POA: Diagnosis not present

## 2012-04-12 DIAGNOSIS — Z87891 Personal history of nicotine dependence: Secondary | ICD-10-CM | POA: Diagnosis not present

## 2012-04-12 DIAGNOSIS — Z808 Family history of malignant neoplasm of other organs or systems: Secondary | ICD-10-CM | POA: Diagnosis not present

## 2012-04-12 DIAGNOSIS — E7800 Pure hypercholesterolemia, unspecified: Secondary | ICD-10-CM | POA: Diagnosis not present

## 2012-04-12 DIAGNOSIS — Z510 Encounter for antineoplastic radiation therapy: Secondary | ICD-10-CM | POA: Diagnosis not present

## 2012-04-12 DIAGNOSIS — M159 Polyosteoarthritis, unspecified: Secondary | ICD-10-CM | POA: Diagnosis not present

## 2012-04-12 DIAGNOSIS — I10 Essential (primary) hypertension: Secondary | ICD-10-CM | POA: Diagnosis not present

## 2012-04-12 DIAGNOSIS — C61 Malignant neoplasm of prostate: Secondary | ICD-10-CM | POA: Diagnosis not present

## 2012-04-13 DIAGNOSIS — I10 Essential (primary) hypertension: Secondary | ICD-10-CM | POA: Diagnosis not present

## 2012-04-13 DIAGNOSIS — M159 Polyosteoarthritis, unspecified: Secondary | ICD-10-CM | POA: Diagnosis not present

## 2012-04-13 DIAGNOSIS — Z808 Family history of malignant neoplasm of other organs or systems: Secondary | ICD-10-CM | POA: Diagnosis not present

## 2012-04-13 DIAGNOSIS — E7800 Pure hypercholesterolemia, unspecified: Secondary | ICD-10-CM | POA: Diagnosis not present

## 2012-04-13 DIAGNOSIS — C61 Malignant neoplasm of prostate: Secondary | ICD-10-CM | POA: Diagnosis not present

## 2012-04-13 DIAGNOSIS — Z510 Encounter for antineoplastic radiation therapy: Secondary | ICD-10-CM | POA: Diagnosis not present

## 2012-04-14 DIAGNOSIS — C61 Malignant neoplasm of prostate: Secondary | ICD-10-CM | POA: Diagnosis not present

## 2012-04-14 DIAGNOSIS — Z510 Encounter for antineoplastic radiation therapy: Secondary | ICD-10-CM | POA: Diagnosis not present

## 2012-04-14 DIAGNOSIS — I10 Essential (primary) hypertension: Secondary | ICD-10-CM | POA: Diagnosis not present

## 2012-04-14 DIAGNOSIS — M159 Polyosteoarthritis, unspecified: Secondary | ICD-10-CM | POA: Diagnosis not present

## 2012-04-14 DIAGNOSIS — E7800 Pure hypercholesterolemia, unspecified: Secondary | ICD-10-CM | POA: Diagnosis not present

## 2012-04-14 DIAGNOSIS — Z808 Family history of malignant neoplasm of other organs or systems: Secondary | ICD-10-CM | POA: Diagnosis not present

## 2012-04-18 DIAGNOSIS — Z510 Encounter for antineoplastic radiation therapy: Secondary | ICD-10-CM | POA: Diagnosis not present

## 2012-04-18 DIAGNOSIS — I10 Essential (primary) hypertension: Secondary | ICD-10-CM | POA: Diagnosis not present

## 2012-04-18 DIAGNOSIS — C61 Malignant neoplasm of prostate: Secondary | ICD-10-CM | POA: Diagnosis not present

## 2012-04-18 DIAGNOSIS — M159 Polyosteoarthritis, unspecified: Secondary | ICD-10-CM | POA: Diagnosis not present

## 2012-04-18 DIAGNOSIS — Z808 Family history of malignant neoplasm of other organs or systems: Secondary | ICD-10-CM | POA: Diagnosis not present

## 2012-04-19 DIAGNOSIS — C61 Malignant neoplasm of prostate: Secondary | ICD-10-CM | POA: Diagnosis not present

## 2012-04-19 DIAGNOSIS — Z510 Encounter for antineoplastic radiation therapy: Secondary | ICD-10-CM | POA: Diagnosis not present

## 2012-04-19 DIAGNOSIS — I10 Essential (primary) hypertension: Secondary | ICD-10-CM | POA: Diagnosis not present

## 2012-04-19 DIAGNOSIS — Z808 Family history of malignant neoplasm of other organs or systems: Secondary | ICD-10-CM | POA: Diagnosis not present

## 2012-04-19 DIAGNOSIS — E7800 Pure hypercholesterolemia, unspecified: Secondary | ICD-10-CM | POA: Diagnosis not present

## 2012-04-20 DIAGNOSIS — E7800 Pure hypercholesterolemia, unspecified: Secondary | ICD-10-CM | POA: Diagnosis not present

## 2012-04-20 DIAGNOSIS — Z510 Encounter for antineoplastic radiation therapy: Secondary | ICD-10-CM | POA: Diagnosis not present

## 2012-04-20 DIAGNOSIS — I10 Essential (primary) hypertension: Secondary | ICD-10-CM | POA: Diagnosis not present

## 2012-04-20 DIAGNOSIS — M159 Polyosteoarthritis, unspecified: Secondary | ICD-10-CM | POA: Diagnosis not present

## 2012-04-20 DIAGNOSIS — Z808 Family history of malignant neoplasm of other organs or systems: Secondary | ICD-10-CM | POA: Diagnosis not present

## 2012-04-20 DIAGNOSIS — C61 Malignant neoplasm of prostate: Secondary | ICD-10-CM | POA: Diagnosis not present

## 2012-04-21 DIAGNOSIS — I10 Essential (primary) hypertension: Secondary | ICD-10-CM | POA: Diagnosis not present

## 2012-04-21 DIAGNOSIS — C61 Malignant neoplasm of prostate: Secondary | ICD-10-CM | POA: Diagnosis not present

## 2012-04-21 DIAGNOSIS — E7800 Pure hypercholesterolemia, unspecified: Secondary | ICD-10-CM | POA: Diagnosis not present

## 2012-04-21 DIAGNOSIS — Z808 Family history of malignant neoplasm of other organs or systems: Secondary | ICD-10-CM | POA: Diagnosis not present

## 2012-04-21 DIAGNOSIS — M159 Polyosteoarthritis, unspecified: Secondary | ICD-10-CM | POA: Diagnosis not present

## 2012-04-22 DIAGNOSIS — Z510 Encounter for antineoplastic radiation therapy: Secondary | ICD-10-CM | POA: Diagnosis not present

## 2012-04-22 DIAGNOSIS — E7800 Pure hypercholesterolemia, unspecified: Secondary | ICD-10-CM | POA: Diagnosis not present

## 2012-04-25 DIAGNOSIS — I10 Essential (primary) hypertension: Secondary | ICD-10-CM | POA: Diagnosis not present

## 2012-04-25 DIAGNOSIS — Z510 Encounter for antineoplastic radiation therapy: Secondary | ICD-10-CM | POA: Diagnosis not present

## 2012-04-25 DIAGNOSIS — E7800 Pure hypercholesterolemia, unspecified: Secondary | ICD-10-CM | POA: Diagnosis not present

## 2012-04-25 DIAGNOSIS — C61 Malignant neoplasm of prostate: Secondary | ICD-10-CM | POA: Diagnosis not present

## 2012-04-25 DIAGNOSIS — Z808 Family history of malignant neoplasm of other organs or systems: Secondary | ICD-10-CM | POA: Diagnosis not present

## 2012-04-25 DIAGNOSIS — M159 Polyosteoarthritis, unspecified: Secondary | ICD-10-CM | POA: Diagnosis not present

## 2012-04-26 DIAGNOSIS — E7800 Pure hypercholesterolemia, unspecified: Secondary | ICD-10-CM | POA: Diagnosis not present

## 2012-04-26 DIAGNOSIS — I10 Essential (primary) hypertension: Secondary | ICD-10-CM | POA: Diagnosis not present

## 2012-04-26 DIAGNOSIS — Z808 Family history of malignant neoplasm of other organs or systems: Secondary | ICD-10-CM | POA: Diagnosis not present

## 2012-04-26 DIAGNOSIS — C61 Malignant neoplasm of prostate: Secondary | ICD-10-CM | POA: Diagnosis not present

## 2012-04-26 DIAGNOSIS — Z510 Encounter for antineoplastic radiation therapy: Secondary | ICD-10-CM | POA: Diagnosis not present

## 2012-04-27 DIAGNOSIS — H3560 Retinal hemorrhage, unspecified eye: Secondary | ICD-10-CM | POA: Diagnosis not present

## 2012-04-28 DIAGNOSIS — Z510 Encounter for antineoplastic radiation therapy: Secondary | ICD-10-CM | POA: Diagnosis not present

## 2012-04-28 DIAGNOSIS — M159 Polyosteoarthritis, unspecified: Secondary | ICD-10-CM | POA: Diagnosis not present

## 2012-04-28 DIAGNOSIS — E7800 Pure hypercholesterolemia, unspecified: Secondary | ICD-10-CM | POA: Diagnosis not present

## 2012-04-28 DIAGNOSIS — Z808 Family history of malignant neoplasm of other organs or systems: Secondary | ICD-10-CM | POA: Diagnosis not present

## 2012-04-28 DIAGNOSIS — I10 Essential (primary) hypertension: Secondary | ICD-10-CM | POA: Diagnosis not present

## 2012-04-29 DIAGNOSIS — E7800 Pure hypercholesterolemia, unspecified: Secondary | ICD-10-CM | POA: Diagnosis not present

## 2012-04-29 DIAGNOSIS — I10 Essential (primary) hypertension: Secondary | ICD-10-CM | POA: Diagnosis not present

## 2012-04-29 DIAGNOSIS — M159 Polyosteoarthritis, unspecified: Secondary | ICD-10-CM | POA: Diagnosis not present

## 2012-04-29 DIAGNOSIS — Z510 Encounter for antineoplastic radiation therapy: Secondary | ICD-10-CM | POA: Diagnosis not present

## 2012-04-29 DIAGNOSIS — C61 Malignant neoplasm of prostate: Secondary | ICD-10-CM | POA: Diagnosis not present

## 2012-04-29 DIAGNOSIS — Z808 Family history of malignant neoplasm of other organs or systems: Secondary | ICD-10-CM | POA: Diagnosis not present

## 2012-05-02 DIAGNOSIS — I10 Essential (primary) hypertension: Secondary | ICD-10-CM | POA: Diagnosis not present

## 2012-05-02 DIAGNOSIS — E7800 Pure hypercholesterolemia, unspecified: Secondary | ICD-10-CM | POA: Diagnosis not present

## 2012-05-02 DIAGNOSIS — C61 Malignant neoplasm of prostate: Secondary | ICD-10-CM | POA: Diagnosis not present

## 2012-05-02 DIAGNOSIS — Z808 Family history of malignant neoplasm of other organs or systems: Secondary | ICD-10-CM | POA: Diagnosis not present

## 2012-05-02 DIAGNOSIS — Z510 Encounter for antineoplastic radiation therapy: Secondary | ICD-10-CM | POA: Diagnosis not present

## 2012-05-02 DIAGNOSIS — M159 Polyosteoarthritis, unspecified: Secondary | ICD-10-CM | POA: Diagnosis not present

## 2012-05-03 DIAGNOSIS — H4310 Vitreous hemorrhage, unspecified eye: Secondary | ICD-10-CM | POA: Diagnosis not present

## 2012-05-03 DIAGNOSIS — H35379 Puckering of macula, unspecified eye: Secondary | ICD-10-CM | POA: Diagnosis not present

## 2012-05-03 DIAGNOSIS — Z510 Encounter for antineoplastic radiation therapy: Secondary | ICD-10-CM | POA: Diagnosis not present

## 2012-05-03 DIAGNOSIS — C61 Malignant neoplasm of prostate: Secondary | ICD-10-CM | POA: Diagnosis not present

## 2012-05-03 DIAGNOSIS — E7800 Pure hypercholesterolemia, unspecified: Secondary | ICD-10-CM | POA: Diagnosis not present

## 2012-05-03 DIAGNOSIS — I10 Essential (primary) hypertension: Secondary | ICD-10-CM | POA: Diagnosis not present

## 2012-05-03 DIAGNOSIS — M159 Polyosteoarthritis, unspecified: Secondary | ICD-10-CM | POA: Diagnosis not present

## 2012-05-03 DIAGNOSIS — Z808 Family history of malignant neoplasm of other organs or systems: Secondary | ICD-10-CM | POA: Diagnosis not present

## 2012-05-04 DIAGNOSIS — Z808 Family history of malignant neoplasm of other organs or systems: Secondary | ICD-10-CM | POA: Diagnosis not present

## 2012-05-04 DIAGNOSIS — C61 Malignant neoplasm of prostate: Secondary | ICD-10-CM | POA: Diagnosis not present

## 2012-05-04 DIAGNOSIS — I10 Essential (primary) hypertension: Secondary | ICD-10-CM | POA: Diagnosis not present

## 2012-05-04 DIAGNOSIS — E7800 Pure hypercholesterolemia, unspecified: Secondary | ICD-10-CM | POA: Diagnosis not present

## 2012-05-04 DIAGNOSIS — Z510 Encounter for antineoplastic radiation therapy: Secondary | ICD-10-CM | POA: Diagnosis not present

## 2012-05-04 DIAGNOSIS — M159 Polyosteoarthritis, unspecified: Secondary | ICD-10-CM | POA: Diagnosis not present

## 2012-05-05 DIAGNOSIS — Z510 Encounter for antineoplastic radiation therapy: Secondary | ICD-10-CM | POA: Diagnosis not present

## 2012-05-05 DIAGNOSIS — Z808 Family history of malignant neoplasm of other organs or systems: Secondary | ICD-10-CM | POA: Diagnosis not present

## 2012-05-05 DIAGNOSIS — E7800 Pure hypercholesterolemia, unspecified: Secondary | ICD-10-CM | POA: Diagnosis not present

## 2012-05-05 DIAGNOSIS — C61 Malignant neoplasm of prostate: Secondary | ICD-10-CM | POA: Diagnosis not present

## 2012-05-05 DIAGNOSIS — M159 Polyosteoarthritis, unspecified: Secondary | ICD-10-CM | POA: Diagnosis not present

## 2012-05-05 DIAGNOSIS — I10 Essential (primary) hypertension: Secondary | ICD-10-CM | POA: Diagnosis not present

## 2012-05-06 DIAGNOSIS — C61 Malignant neoplasm of prostate: Secondary | ICD-10-CM | POA: Diagnosis not present

## 2012-05-06 DIAGNOSIS — E7800 Pure hypercholesterolemia, unspecified: Secondary | ICD-10-CM | POA: Diagnosis not present

## 2012-05-06 DIAGNOSIS — M159 Polyosteoarthritis, unspecified: Secondary | ICD-10-CM | POA: Diagnosis not present

## 2012-05-06 DIAGNOSIS — Z510 Encounter for antineoplastic radiation therapy: Secondary | ICD-10-CM | POA: Diagnosis not present

## 2012-05-06 DIAGNOSIS — Z808 Family history of malignant neoplasm of other organs or systems: Secondary | ICD-10-CM | POA: Diagnosis not present

## 2012-05-06 DIAGNOSIS — I10 Essential (primary) hypertension: Secondary | ICD-10-CM | POA: Diagnosis not present

## 2012-05-09 DIAGNOSIS — I10 Essential (primary) hypertension: Secondary | ICD-10-CM | POA: Diagnosis not present

## 2012-05-09 DIAGNOSIS — M159 Polyosteoarthritis, unspecified: Secondary | ICD-10-CM | POA: Diagnosis not present

## 2012-05-09 DIAGNOSIS — C61 Malignant neoplasm of prostate: Secondary | ICD-10-CM | POA: Diagnosis not present

## 2012-05-09 DIAGNOSIS — E7800 Pure hypercholesterolemia, unspecified: Secondary | ICD-10-CM | POA: Diagnosis not present

## 2012-05-09 DIAGNOSIS — Z510 Encounter for antineoplastic radiation therapy: Secondary | ICD-10-CM | POA: Diagnosis not present

## 2012-05-09 DIAGNOSIS — Z808 Family history of malignant neoplasm of other organs or systems: Secondary | ICD-10-CM | POA: Diagnosis not present

## 2012-05-10 DIAGNOSIS — Z808 Family history of malignant neoplasm of other organs or systems: Secondary | ICD-10-CM | POA: Diagnosis not present

## 2012-05-10 DIAGNOSIS — I10 Essential (primary) hypertension: Secondary | ICD-10-CM | POA: Diagnosis not present

## 2012-05-10 DIAGNOSIS — Z87891 Personal history of nicotine dependence: Secondary | ICD-10-CM | POA: Diagnosis not present

## 2012-05-10 DIAGNOSIS — Z510 Encounter for antineoplastic radiation therapy: Secondary | ICD-10-CM | POA: Diagnosis not present

## 2012-05-10 DIAGNOSIS — C61 Malignant neoplasm of prostate: Secondary | ICD-10-CM | POA: Diagnosis not present

## 2012-05-10 DIAGNOSIS — E7800 Pure hypercholesterolemia, unspecified: Secondary | ICD-10-CM | POA: Diagnosis not present

## 2012-05-11 DIAGNOSIS — I10 Essential (primary) hypertension: Secondary | ICD-10-CM | POA: Diagnosis not present

## 2012-05-11 DIAGNOSIS — Z808 Family history of malignant neoplasm of other organs or systems: Secondary | ICD-10-CM | POA: Diagnosis not present

## 2012-05-11 DIAGNOSIS — C61 Malignant neoplasm of prostate: Secondary | ICD-10-CM | POA: Diagnosis not present

## 2012-05-11 DIAGNOSIS — Z87891 Personal history of nicotine dependence: Secondary | ICD-10-CM | POA: Diagnosis not present

## 2012-05-11 DIAGNOSIS — Z510 Encounter for antineoplastic radiation therapy: Secondary | ICD-10-CM | POA: Diagnosis not present

## 2012-05-11 DIAGNOSIS — E7800 Pure hypercholesterolemia, unspecified: Secondary | ICD-10-CM | POA: Diagnosis not present

## 2012-05-12 DIAGNOSIS — C61 Malignant neoplasm of prostate: Secondary | ICD-10-CM | POA: Diagnosis not present

## 2012-05-12 DIAGNOSIS — I10 Essential (primary) hypertension: Secondary | ICD-10-CM | POA: Diagnosis not present

## 2012-05-12 DIAGNOSIS — Z808 Family history of malignant neoplasm of other organs or systems: Secondary | ICD-10-CM | POA: Diagnosis not present

## 2012-05-12 DIAGNOSIS — Z87891 Personal history of nicotine dependence: Secondary | ICD-10-CM | POA: Diagnosis not present

## 2012-05-12 DIAGNOSIS — Z510 Encounter for antineoplastic radiation therapy: Secondary | ICD-10-CM | POA: Diagnosis not present

## 2012-05-12 DIAGNOSIS — E7800 Pure hypercholesterolemia, unspecified: Secondary | ICD-10-CM | POA: Diagnosis not present

## 2012-05-13 DIAGNOSIS — C61 Malignant neoplasm of prostate: Secondary | ICD-10-CM | POA: Diagnosis not present

## 2012-05-13 DIAGNOSIS — E7800 Pure hypercholesterolemia, unspecified: Secondary | ICD-10-CM | POA: Diagnosis not present

## 2012-05-13 DIAGNOSIS — Z510 Encounter for antineoplastic radiation therapy: Secondary | ICD-10-CM | POA: Diagnosis not present

## 2012-05-13 DIAGNOSIS — Z808 Family history of malignant neoplasm of other organs or systems: Secondary | ICD-10-CM | POA: Diagnosis not present

## 2012-05-13 DIAGNOSIS — I10 Essential (primary) hypertension: Secondary | ICD-10-CM | POA: Diagnosis not present

## 2012-05-13 DIAGNOSIS — Z87891 Personal history of nicotine dependence: Secondary | ICD-10-CM | POA: Diagnosis not present

## 2012-05-16 DIAGNOSIS — Z510 Encounter for antineoplastic radiation therapy: Secondary | ICD-10-CM | POA: Diagnosis not present

## 2012-05-16 DIAGNOSIS — C61 Malignant neoplasm of prostate: Secondary | ICD-10-CM | POA: Diagnosis not present

## 2012-05-16 DIAGNOSIS — E7800 Pure hypercholesterolemia, unspecified: Secondary | ICD-10-CM | POA: Diagnosis not present

## 2012-05-16 DIAGNOSIS — Z87891 Personal history of nicotine dependence: Secondary | ICD-10-CM | POA: Diagnosis not present

## 2012-05-16 DIAGNOSIS — Z808 Family history of malignant neoplasm of other organs or systems: Secondary | ICD-10-CM | POA: Diagnosis not present

## 2012-05-16 DIAGNOSIS — I10 Essential (primary) hypertension: Secondary | ICD-10-CM | POA: Diagnosis not present

## 2012-05-17 DIAGNOSIS — Z808 Family history of malignant neoplasm of other organs or systems: Secondary | ICD-10-CM | POA: Diagnosis not present

## 2012-05-17 DIAGNOSIS — E7800 Pure hypercholesterolemia, unspecified: Secondary | ICD-10-CM | POA: Diagnosis not present

## 2012-05-17 DIAGNOSIS — Z510 Encounter for antineoplastic radiation therapy: Secondary | ICD-10-CM | POA: Diagnosis not present

## 2012-05-17 DIAGNOSIS — I10 Essential (primary) hypertension: Secondary | ICD-10-CM | POA: Diagnosis not present

## 2012-05-17 DIAGNOSIS — H35349 Macular cyst, hole, or pseudohole, unspecified eye: Secondary | ICD-10-CM | POA: Diagnosis not present

## 2012-05-17 DIAGNOSIS — H2510 Age-related nuclear cataract, unspecified eye: Secondary | ICD-10-CM | POA: Diagnosis not present

## 2012-05-17 DIAGNOSIS — H35379 Puckering of macula, unspecified eye: Secondary | ICD-10-CM | POA: Diagnosis not present

## 2012-05-17 DIAGNOSIS — C61 Malignant neoplasm of prostate: Secondary | ICD-10-CM | POA: Diagnosis not present

## 2012-05-17 DIAGNOSIS — Z87891 Personal history of nicotine dependence: Secondary | ICD-10-CM | POA: Diagnosis not present

## 2012-05-18 DIAGNOSIS — E7800 Pure hypercholesterolemia, unspecified: Secondary | ICD-10-CM | POA: Diagnosis not present

## 2012-05-18 DIAGNOSIS — Z808 Family history of malignant neoplasm of other organs or systems: Secondary | ICD-10-CM | POA: Diagnosis not present

## 2012-05-18 DIAGNOSIS — I10 Essential (primary) hypertension: Secondary | ICD-10-CM | POA: Diagnosis not present

## 2012-05-18 DIAGNOSIS — C61 Malignant neoplasm of prostate: Secondary | ICD-10-CM | POA: Diagnosis not present

## 2012-05-18 DIAGNOSIS — Z510 Encounter for antineoplastic radiation therapy: Secondary | ICD-10-CM | POA: Diagnosis not present

## 2012-05-18 DIAGNOSIS — Z87891 Personal history of nicotine dependence: Secondary | ICD-10-CM | POA: Diagnosis not present

## 2012-05-19 DIAGNOSIS — E7800 Pure hypercholesterolemia, unspecified: Secondary | ICD-10-CM | POA: Diagnosis not present

## 2012-05-19 DIAGNOSIS — Z87891 Personal history of nicotine dependence: Secondary | ICD-10-CM | POA: Diagnosis not present

## 2012-05-19 DIAGNOSIS — Z808 Family history of malignant neoplasm of other organs or systems: Secondary | ICD-10-CM | POA: Diagnosis not present

## 2012-05-19 DIAGNOSIS — Z510 Encounter for antineoplastic radiation therapy: Secondary | ICD-10-CM | POA: Diagnosis not present

## 2012-05-19 DIAGNOSIS — C61 Malignant neoplasm of prostate: Secondary | ICD-10-CM | POA: Diagnosis not present

## 2012-05-19 DIAGNOSIS — I10 Essential (primary) hypertension: Secondary | ICD-10-CM | POA: Diagnosis not present

## 2012-05-20 DIAGNOSIS — C61 Malignant neoplasm of prostate: Secondary | ICD-10-CM | POA: Diagnosis not present

## 2012-05-20 DIAGNOSIS — E7800 Pure hypercholesterolemia, unspecified: Secondary | ICD-10-CM | POA: Diagnosis not present

## 2012-05-20 DIAGNOSIS — Z808 Family history of malignant neoplasm of other organs or systems: Secondary | ICD-10-CM | POA: Diagnosis not present

## 2012-05-20 DIAGNOSIS — I10 Essential (primary) hypertension: Secondary | ICD-10-CM | POA: Diagnosis not present

## 2012-05-20 DIAGNOSIS — Z510 Encounter for antineoplastic radiation therapy: Secondary | ICD-10-CM | POA: Diagnosis not present

## 2012-05-20 DIAGNOSIS — Z87891 Personal history of nicotine dependence: Secondary | ICD-10-CM | POA: Diagnosis not present

## 2012-05-24 DIAGNOSIS — I10 Essential (primary) hypertension: Secondary | ICD-10-CM | POA: Diagnosis not present

## 2012-05-24 DIAGNOSIS — E7800 Pure hypercholesterolemia, unspecified: Secondary | ICD-10-CM | POA: Diagnosis not present

## 2012-05-24 DIAGNOSIS — Z510 Encounter for antineoplastic radiation therapy: Secondary | ICD-10-CM | POA: Diagnosis not present

## 2012-05-24 DIAGNOSIS — Z87891 Personal history of nicotine dependence: Secondary | ICD-10-CM | POA: Diagnosis not present

## 2012-05-24 DIAGNOSIS — Z808 Family history of malignant neoplasm of other organs or systems: Secondary | ICD-10-CM | POA: Diagnosis not present

## 2012-05-24 DIAGNOSIS — C61 Malignant neoplasm of prostate: Secondary | ICD-10-CM | POA: Diagnosis not present

## 2012-05-25 DIAGNOSIS — Z808 Family history of malignant neoplasm of other organs or systems: Secondary | ICD-10-CM | POA: Diagnosis not present

## 2012-05-25 DIAGNOSIS — I10 Essential (primary) hypertension: Secondary | ICD-10-CM | POA: Diagnosis not present

## 2012-05-25 DIAGNOSIS — E7800 Pure hypercholesterolemia, unspecified: Secondary | ICD-10-CM | POA: Diagnosis not present

## 2012-05-25 DIAGNOSIS — C61 Malignant neoplasm of prostate: Secondary | ICD-10-CM | POA: Diagnosis not present

## 2012-05-25 DIAGNOSIS — Z510 Encounter for antineoplastic radiation therapy: Secondary | ICD-10-CM | POA: Diagnosis not present

## 2012-05-25 DIAGNOSIS — Z87891 Personal history of nicotine dependence: Secondary | ICD-10-CM | POA: Diagnosis not present

## 2012-05-26 DIAGNOSIS — I10 Essential (primary) hypertension: Secondary | ICD-10-CM | POA: Diagnosis not present

## 2012-05-26 DIAGNOSIS — Z510 Encounter for antineoplastic radiation therapy: Secondary | ICD-10-CM | POA: Diagnosis not present

## 2012-05-26 DIAGNOSIS — E7800 Pure hypercholesterolemia, unspecified: Secondary | ICD-10-CM | POA: Diagnosis not present

## 2012-05-26 DIAGNOSIS — C61 Malignant neoplasm of prostate: Secondary | ICD-10-CM | POA: Diagnosis not present

## 2012-05-26 DIAGNOSIS — Z87891 Personal history of nicotine dependence: Secondary | ICD-10-CM | POA: Diagnosis not present

## 2012-05-26 DIAGNOSIS — Z808 Family history of malignant neoplasm of other organs or systems: Secondary | ICD-10-CM | POA: Diagnosis not present

## 2012-05-30 DIAGNOSIS — C61 Malignant neoplasm of prostate: Secondary | ICD-10-CM | POA: Diagnosis not present

## 2012-05-30 DIAGNOSIS — Z87891 Personal history of nicotine dependence: Secondary | ICD-10-CM | POA: Diagnosis not present

## 2012-05-30 DIAGNOSIS — E7800 Pure hypercholesterolemia, unspecified: Secondary | ICD-10-CM | POA: Diagnosis not present

## 2012-05-30 DIAGNOSIS — I10 Essential (primary) hypertension: Secondary | ICD-10-CM | POA: Diagnosis not present

## 2012-05-30 DIAGNOSIS — Z510 Encounter for antineoplastic radiation therapy: Secondary | ICD-10-CM | POA: Diagnosis not present

## 2012-05-30 DIAGNOSIS — Z808 Family history of malignant neoplasm of other organs or systems: Secondary | ICD-10-CM | POA: Diagnosis not present

## 2012-05-31 DIAGNOSIS — Z87891 Personal history of nicotine dependence: Secondary | ICD-10-CM | POA: Diagnosis not present

## 2012-05-31 DIAGNOSIS — C61 Malignant neoplasm of prostate: Secondary | ICD-10-CM | POA: Diagnosis not present

## 2012-05-31 DIAGNOSIS — E7800 Pure hypercholesterolemia, unspecified: Secondary | ICD-10-CM | POA: Diagnosis not present

## 2012-05-31 DIAGNOSIS — Z808 Family history of malignant neoplasm of other organs or systems: Secondary | ICD-10-CM | POA: Diagnosis not present

## 2012-05-31 DIAGNOSIS — Z510 Encounter for antineoplastic radiation therapy: Secondary | ICD-10-CM | POA: Diagnosis not present

## 2012-05-31 DIAGNOSIS — I10 Essential (primary) hypertension: Secondary | ICD-10-CM | POA: Diagnosis not present

## 2012-06-01 DIAGNOSIS — Z808 Family history of malignant neoplasm of other organs or systems: Secondary | ICD-10-CM | POA: Diagnosis not present

## 2012-06-01 DIAGNOSIS — I10 Essential (primary) hypertension: Secondary | ICD-10-CM | POA: Diagnosis not present

## 2012-06-01 DIAGNOSIS — E7800 Pure hypercholesterolemia, unspecified: Secondary | ICD-10-CM | POA: Diagnosis not present

## 2012-06-01 DIAGNOSIS — Z510 Encounter for antineoplastic radiation therapy: Secondary | ICD-10-CM | POA: Diagnosis not present

## 2012-06-01 DIAGNOSIS — Z87891 Personal history of nicotine dependence: Secondary | ICD-10-CM | POA: Diagnosis not present

## 2012-06-01 DIAGNOSIS — C61 Malignant neoplasm of prostate: Secondary | ICD-10-CM | POA: Diagnosis not present

## 2012-06-02 DIAGNOSIS — H35379 Puckering of macula, unspecified eye: Secondary | ICD-10-CM | POA: Diagnosis not present

## 2012-06-02 DIAGNOSIS — H2510 Age-related nuclear cataract, unspecified eye: Secondary | ICD-10-CM | POA: Diagnosis not present

## 2012-06-29 DIAGNOSIS — C61 Malignant neoplasm of prostate: Secondary | ICD-10-CM | POA: Diagnosis not present

## 2012-07-08 DIAGNOSIS — C61 Malignant neoplasm of prostate: Secondary | ICD-10-CM | POA: Diagnosis not present

## 2012-07-08 DIAGNOSIS — Z09 Encounter for follow-up examination after completed treatment for conditions other than malignant neoplasm: Secondary | ICD-10-CM | POA: Diagnosis not present

## 2012-07-08 DIAGNOSIS — I10 Essential (primary) hypertension: Secondary | ICD-10-CM | POA: Diagnosis not present

## 2012-07-08 DIAGNOSIS — Z808 Family history of malignant neoplasm of other organs or systems: Secondary | ICD-10-CM | POA: Diagnosis not present

## 2012-07-08 DIAGNOSIS — E7800 Pure hypercholesterolemia, unspecified: Secondary | ICD-10-CM | POA: Diagnosis not present

## 2012-07-08 DIAGNOSIS — Z87891 Personal history of nicotine dependence: Secondary | ICD-10-CM | POA: Diagnosis not present

## 2012-07-18 DIAGNOSIS — I10 Essential (primary) hypertension: Secondary | ICD-10-CM | POA: Diagnosis not present

## 2012-07-18 DIAGNOSIS — K219 Gastro-esophageal reflux disease without esophagitis: Secondary | ICD-10-CM | POA: Diagnosis not present

## 2012-07-18 DIAGNOSIS — C61 Malignant neoplasm of prostate: Secondary | ICD-10-CM | POA: Diagnosis not present

## 2012-07-18 DIAGNOSIS — J309 Allergic rhinitis, unspecified: Secondary | ICD-10-CM | POA: Diagnosis not present

## 2012-07-18 DIAGNOSIS — Z0000 Encounter for general adult medical examination without abnormal findings: Secondary | ICD-10-CM | POA: Diagnosis not present

## 2012-07-18 DIAGNOSIS — G4700 Insomnia, unspecified: Secondary | ICD-10-CM | POA: Diagnosis not present

## 2012-07-18 DIAGNOSIS — Z23 Encounter for immunization: Secondary | ICD-10-CM | POA: Diagnosis not present

## 2012-07-18 DIAGNOSIS — E782 Mixed hyperlipidemia: Secondary | ICD-10-CM | POA: Diagnosis not present

## 2012-09-15 DIAGNOSIS — C61 Malignant neoplasm of prostate: Secondary | ICD-10-CM | POA: Diagnosis not present

## 2012-11-15 ENCOUNTER — Encounter: Payer: Self-pay | Admitting: Internal Medicine

## 2012-12-15 DIAGNOSIS — C61 Malignant neoplasm of prostate: Secondary | ICD-10-CM | POA: Diagnosis not present

## 2013-01-17 DIAGNOSIS — I10 Essential (primary) hypertension: Secondary | ICD-10-CM | POA: Diagnosis not present

## 2013-01-17 DIAGNOSIS — E782 Mixed hyperlipidemia: Secondary | ICD-10-CM | POA: Diagnosis not present

## 2013-01-17 DIAGNOSIS — G4700 Insomnia, unspecified: Secondary | ICD-10-CM | POA: Diagnosis not present

## 2013-01-17 DIAGNOSIS — K219 Gastro-esophageal reflux disease without esophagitis: Secondary | ICD-10-CM | POA: Diagnosis not present

## 2013-01-17 DIAGNOSIS — C61 Malignant neoplasm of prostate: Secondary | ICD-10-CM | POA: Diagnosis not present

## 2013-01-17 DIAGNOSIS — Z23 Encounter for immunization: Secondary | ICD-10-CM | POA: Diagnosis not present

## 2013-01-17 DIAGNOSIS — J309 Allergic rhinitis, unspecified: Secondary | ICD-10-CM | POA: Diagnosis not present

## 2013-02-07 ENCOUNTER — Encounter: Payer: Medicare Other | Admitting: Internal Medicine

## 2013-03-16 DIAGNOSIS — C61 Malignant neoplasm of prostate: Secondary | ICD-10-CM | POA: Diagnosis not present

## 2013-04-04 DIAGNOSIS — J0100 Acute maxillary sinusitis, unspecified: Secondary | ICD-10-CM | POA: Diagnosis not present

## 2013-04-04 DIAGNOSIS — J209 Acute bronchitis, unspecified: Secondary | ICD-10-CM | POA: Diagnosis not present

## 2013-04-11 DIAGNOSIS — H35379 Puckering of macula, unspecified eye: Secondary | ICD-10-CM | POA: Diagnosis not present

## 2013-04-19 DIAGNOSIS — H35379 Puckering of macula, unspecified eye: Secondary | ICD-10-CM | POA: Diagnosis not present

## 2013-04-19 DIAGNOSIS — H5460 Unqualified visual loss, one eye, unspecified: Secondary | ICD-10-CM | POA: Diagnosis not present

## 2013-06-15 DIAGNOSIS — H35379 Puckering of macula, unspecified eye: Secondary | ICD-10-CM | POA: Diagnosis not present

## 2013-06-30 DIAGNOSIS — C61 Malignant neoplasm of prostate: Secondary | ICD-10-CM | POA: Diagnosis not present

## 2013-06-30 DIAGNOSIS — N529 Male erectile dysfunction, unspecified: Secondary | ICD-10-CM | POA: Diagnosis not present

## 2013-07-07 ENCOUNTER — Encounter: Payer: Self-pay | Admitting: Internal Medicine

## 2013-07-25 DIAGNOSIS — H35379 Puckering of macula, unspecified eye: Secondary | ICD-10-CM | POA: Diagnosis not present

## 2013-07-25 DIAGNOSIS — H2510 Age-related nuclear cataract, unspecified eye: Secondary | ICD-10-CM | POA: Diagnosis not present

## 2013-08-04 DIAGNOSIS — J309 Allergic rhinitis, unspecified: Secondary | ICD-10-CM | POA: Diagnosis not present

## 2013-08-04 DIAGNOSIS — M79609 Pain in unspecified limb: Secondary | ICD-10-CM | POA: Diagnosis not present

## 2013-08-04 DIAGNOSIS — I10 Essential (primary) hypertension: Secondary | ICD-10-CM | POA: Diagnosis not present

## 2013-08-04 DIAGNOSIS — C61 Malignant neoplasm of prostate: Secondary | ICD-10-CM | POA: Diagnosis not present

## 2013-08-04 DIAGNOSIS — K219 Gastro-esophageal reflux disease without esophagitis: Secondary | ICD-10-CM | POA: Diagnosis not present

## 2013-08-04 DIAGNOSIS — Z1331 Encounter for screening for depression: Secondary | ICD-10-CM | POA: Diagnosis not present

## 2013-08-04 DIAGNOSIS — E782 Mixed hyperlipidemia: Secondary | ICD-10-CM | POA: Diagnosis not present

## 2013-08-04 DIAGNOSIS — N183 Chronic kidney disease, stage 3 unspecified: Secondary | ICD-10-CM | POA: Diagnosis not present

## 2013-08-04 DIAGNOSIS — G4700 Insomnia, unspecified: Secondary | ICD-10-CM | POA: Diagnosis not present

## 2013-08-07 DIAGNOSIS — H2510 Age-related nuclear cataract, unspecified eye: Secondary | ICD-10-CM | POA: Diagnosis not present

## 2013-08-07 DIAGNOSIS — H269 Unspecified cataract: Secondary | ICD-10-CM | POA: Diagnosis not present

## 2013-09-21 DIAGNOSIS — H35379 Puckering of macula, unspecified eye: Secondary | ICD-10-CM | POA: Diagnosis not present

## 2013-09-21 DIAGNOSIS — H35369 Drusen (degenerative) of macula, unspecified eye: Secondary | ICD-10-CM | POA: Diagnosis not present

## 2013-10-20 ENCOUNTER — Encounter: Payer: Self-pay | Admitting: *Deleted

## 2013-10-31 DIAGNOSIS — N529 Male erectile dysfunction, unspecified: Secondary | ICD-10-CM | POA: Diagnosis not present

## 2013-10-31 DIAGNOSIS — C61 Malignant neoplasm of prostate: Secondary | ICD-10-CM | POA: Diagnosis not present

## 2013-11-28 ENCOUNTER — Encounter: Payer: Self-pay | Admitting: Internal Medicine

## 2013-12-12 DIAGNOSIS — Z23 Encounter for immunization: Secondary | ICD-10-CM | POA: Diagnosis not present

## 2014-01-10 ENCOUNTER — Ambulatory Visit (AMBULATORY_SURGERY_CENTER): Payer: Self-pay | Admitting: *Deleted

## 2014-01-10 VITALS — Ht 69.0 in | Wt 201.8 lb

## 2014-01-10 DIAGNOSIS — Z86010 Personal history of colonic polyps: Secondary | ICD-10-CM

## 2014-01-10 MED ORDER — MOVIPREP 100 G PO SOLR: 1.0000 | Freq: Once | ORAL | Status: DC

## 2014-01-10 NOTE — Progress Notes (Signed)
No egg or soy allergy, no issues with past sedation, no blood thinners, no diet pills, no home 02 use. ewm

## 2014-01-24 ENCOUNTER — Ambulatory Visit (AMBULATORY_SURGERY_CENTER): Payer: Medicare Other | Admitting: Internal Medicine

## 2014-01-24 ENCOUNTER — Encounter: Payer: Self-pay | Admitting: Internal Medicine

## 2014-01-24 VITALS — BP 140/89 | HR 65 | Temp 96.4°F | Resp 28 | Ht 69.0 in | Wt 201.0 lb

## 2014-01-24 DIAGNOSIS — I10 Essential (primary) hypertension: Secondary | ICD-10-CM | POA: Diagnosis not present

## 2014-01-24 DIAGNOSIS — Z86010 Personal history of colonic polyps: Secondary | ICD-10-CM

## 2014-01-24 DIAGNOSIS — K635 Polyp of colon: Secondary | ICD-10-CM | POA: Diagnosis not present

## 2014-01-24 DIAGNOSIS — D123 Benign neoplasm of transverse colon: Secondary | ICD-10-CM

## 2014-01-24 DIAGNOSIS — K219 Gastro-esophageal reflux disease without esophagitis: Secondary | ICD-10-CM | POA: Diagnosis not present

## 2014-01-24 MED ORDER — SODIUM CHLORIDE 0.9 % IV SOLN: 500.0000 mL | INTRAVENOUS | Status: DC

## 2014-01-24 NOTE — Progress Notes (Signed)
Called to room to assist during endoscopic procedure.  Patient ID and intended procedure confirmed with present staff. Received instructions for my participation in the procedure from the performing physician.  

## 2014-01-24 NOTE — Progress Notes (Signed)
Stable to RR 

## 2014-01-24 NOTE — Patient Instructions (Signed)
Colon polyp removed today, handout given on polyps. Repeat colonoscopy in 5 years. Resume current medications. Call us with any questions or concerns. Thank you!  YOU HAD AN ENDOSCOPIC PROCEDURE TODAY AT [facility]: Refer to the procedure report that was given to you for any specific questions about what was found during the examination.  If the procedure report does not answer your questions, please call your gastroenterologist to clarify.  If you requested that your care partner not be given the details of your procedure findings, then the procedure report has been included in a sealed envelope for you to review at your convenience later.  YOU SHOULD EXPECT: Some feelings of bloating in the abdomen. Passage of more gas than usual.  Walking can help get rid of the air that was put into your GI tract during the procedure and reduce the bloating. If you had a lower endoscopy (such as a colonoscopy or flexible sigmoidoscopy) you may notice spotting of blood in your stool or on the toilet paper. If you underwent a bowel prep for your procedure, then you may not have a normal bowel movement for a few days.  DIET: Your first meal following the procedure should be a light meal and then it is ok to progress to your normal diet.  A half-sandwich or bowl of soup is an example of a good first meal.  Heavy or fried foods are harder to digest and may make you feel nauseous or bloated.  Likewise meals heavy in dairy and vegetables can cause extra gas to form and this can also increase the bloating.  Drink plenty of fluids but you should avoid alcoholic beverages for 24 hours.  ACTIVITY: Your care partner should take you home directly after the procedure.  You should plan to take it easy, moving slowly for the rest of the day.  You can resume normal activity the day after the procedure however you should NOT DRIVE or use heavy machinery for 24 hours (because of the sedation medicines used during the  test).    SYMPTOMS TO REPORT IMMEDIATELY: A gastroenterologist can be reached at any hour.  During normal business hours, 8:30 AM to 5:00 PM Monday through Friday, call [phone].  After hours and on weekends, please call the GI answering service at [phone] who will take a message and have the physician on call contact you.   Following lower endoscopy (colonoscopy or flexible sigmoidoscopy):  Excessive amounts of blood in the stool  Significant tenderness or worsening of abdominal pains  Swelling of the abdomen that is new, acute  Fever of 100F or higher  Following upper endoscopy (EGD)  Vomiting of blood or coffee ground material  New chest pain or pain under the shoulder blades  Painful or persistently difficult swallowing  New shortness of breath  Fever of 100F or higher  Black, tarry-looking stools  FOLLOW UP: If any biopsies were taken you will be contacted by phone or by letter within the next 1-3 weeks.  Call your gastroenterologist if you have not heard about the biopsies in 3 weeks.  Our staff will call the home number listed on your records the next business day following your procedure to check on you and address any questions or concerns that you may have at that time regarding the information given to you following your procedure. This is a courtesy call and so if there is no answer at the home number and we have not heard from you  through the emergency physician on call, we will assume that you have returned to your regular daily activities without incident.  SIGNATURES/CONFIDENTIALITY: You and/or your care partner have signed paperwork which will be entered into your electronic medical record.  These signatures attest to the fact that that the information above on your After Visit Summary has been reviewed and is understood.  Full responsibility of the confidentiality of this discharge information lies with you and/or your care-partner.

## 2014-01-24 NOTE — Op Note (Signed)
[facility]  [address] [place], [address]   COLONOSCOPY PROCEDURE REPORT  PATIENT: Kurt, Hall  MR#: [id number] BIRTHDATE: 06-28-1945 , 68  yrs. old GENDER: male ENDOSCOPIST: Jerene Bears, MD PROCEDURE DATE:  01/24/2014 PROCEDURE:   Colonoscopy with snare polypectomy First Screening Colonoscopy - Avg.  risk and is 50 yrs.  old or older - No.  Prior Negative Screening - Now for repeat screening. N/A  History of Adenoma - Now for follow-up colonoscopy & has been > or = to 3 yrs.  Yes hx of adenoma.  Has been 3 or more years since last colonoscopy.  History of Adenoma - Now for follow-up colonoscopy & has been > or = to 3 yrs.  Polyps Removed Today? Yes. ASA CLASS:   Class II INDICATIONS:surveillance colonoscopy based on a history of adenomatous colonic polyp(s). MEDICATIONS: Monitored anesthesia care, Propofol 400 mg IV, and lidocaine 40 mg IV  DESCRIPTION OF PROCEDURE:   After the risks benefits and alternatives of the procedure were thoroughly explained, informed consent was obtained.  The digital rectal exam revealed a skin tag. The LB [id number]  endoscope was introduced through the anus and advanced to the terminal ileum which was intubated for a short distance. No adverse events experienced.   The quality of the prep was good, using MoviPrep  The instrument was then slowly withdrawn as the colon was fully examined.   COLON FINDINGS: The examined terminal ileum appeared to be normal. A sessile polyp, 5 mm,  was found in the transverse colon. Polyp was removed with cold snare, complete removal and retrieval.  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  Retroflexed views revealed internal hemorrhoids. The time to cecum=6 minutes 55 seconds. Withdrawal time=9 minutes 07 seconds.  The scope was withdrawn and the procedure completed. COMPLICATIONS: There were no immediate  complications.  ENDOSCOPIC IMPRESSION: 1.   The examined terminal ileum appeared to be normal 2.   Sessile polyp was found in the transverse colon, removed with cold snare and sent to pathology 3.   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  Await pathology results 2.  Repeat Colonoscopy in 5 years. 3.  You will receive a letter within 1-2 weeks with the results of your biopsy as well as final recommendations.  Please call my office if you have not received a letter after 3 weeks.  eSigned:  Jerene Bears, MD 01/24/2014 9:49 AM   cc: Antony Contras, MD and The Patient

## 2014-01-25 ENCOUNTER — Telehealth: Payer: Self-pay | Admitting: *Deleted

## 2014-01-25 NOTE — Telephone Encounter (Signed)
  Follow up Call-  Call back number 01/24/2014 12/30/2011  Post procedure Call Back phone  # [phone] or cell [phone]  Permission to leave phone message Yes Yes     Patient questions:  Do you have a fever, pain , or abdominal swelling? No. Pain Score  0 *  Have you tolerated food without any problems? Yes.    Have you been able to return to your normal activities? Yes.    Do you have any questions about your discharge instructions: Diet   No. Medications  No. Follow up visit  No.  Do you have questions or concerns about your Care? No.  Actions: * If pain score is 4 or above: No action needed, pain <4.

## 2014-01-30 ENCOUNTER — Encounter: Payer: Self-pay | Admitting: Internal Medicine

## 2014-02-22 DIAGNOSIS — N183 Chronic kidney disease, stage 3 (moderate): Secondary | ICD-10-CM | POA: Diagnosis not present

## 2014-02-22 DIAGNOSIS — J309 Allergic rhinitis, unspecified: Secondary | ICD-10-CM | POA: Diagnosis not present

## 2014-02-22 DIAGNOSIS — Z1211 Encounter for screening for malignant neoplasm of colon: Secondary | ICD-10-CM | POA: Diagnosis not present

## 2014-02-22 DIAGNOSIS — K219 Gastro-esophageal reflux disease without esophagitis: Secondary | ICD-10-CM | POA: Diagnosis not present

## 2014-02-22 DIAGNOSIS — G4700 Insomnia, unspecified: Secondary | ICD-10-CM | POA: Diagnosis not present

## 2014-02-22 DIAGNOSIS — Z1389 Encounter for screening for other disorder: Secondary | ICD-10-CM | POA: Diagnosis not present

## 2014-02-22 DIAGNOSIS — E782 Mixed hyperlipidemia: Secondary | ICD-10-CM | POA: Diagnosis not present

## 2014-02-22 DIAGNOSIS — Z0000 Encounter for general adult medical examination without abnormal findings: Secondary | ICD-10-CM | POA: Diagnosis not present

## 2014-02-22 DIAGNOSIS — Z23 Encounter for immunization: Secondary | ICD-10-CM | POA: Diagnosis not present

## 2014-02-22 DIAGNOSIS — C61 Malignant neoplasm of prostate: Secondary | ICD-10-CM | POA: Diagnosis not present

## 2014-02-22 DIAGNOSIS — I10 Essential (primary) hypertension: Secondary | ICD-10-CM | POA: Diagnosis not present

## 2014-03-06 DIAGNOSIS — Z961 Presence of intraocular lens: Secondary | ICD-10-CM | POA: Diagnosis not present

## 2014-03-06 DIAGNOSIS — H10413 Chronic giant papillary conjunctivitis, bilateral: Secondary | ICD-10-CM | POA: Diagnosis not present

## 2014-03-06 DIAGNOSIS — H2511 Age-related nuclear cataract, right eye: Secondary | ICD-10-CM | POA: Diagnosis not present

## 2014-03-06 DIAGNOSIS — H35372 Puckering of macula, left eye: Secondary | ICD-10-CM | POA: Diagnosis not present

## 2014-03-06 DIAGNOSIS — J301 Allergic rhinitis due to pollen: Secondary | ICD-10-CM | POA: Diagnosis not present

## 2014-03-06 DIAGNOSIS — H04123 Dry eye syndrome of bilateral lacrimal glands: Secondary | ICD-10-CM | POA: Diagnosis not present

## 2014-03-06 DIAGNOSIS — R0982 Postnasal drip: Secondary | ICD-10-CM | POA: Diagnosis not present

## 2014-05-01 DIAGNOSIS — N528 Other male erectile dysfunction: Secondary | ICD-10-CM | POA: Diagnosis not present

## 2014-05-01 DIAGNOSIS — C61 Malignant neoplasm of prostate: Secondary | ICD-10-CM | POA: Diagnosis not present

## 2014-07-30 DIAGNOSIS — L570 Actinic keratosis: Secondary | ICD-10-CM | POA: Diagnosis not present

## 2014-07-30 DIAGNOSIS — L578 Other skin changes due to chronic exposure to nonionizing radiation: Secondary | ICD-10-CM | POA: Diagnosis not present

## 2014-08-24 DIAGNOSIS — K219 Gastro-esophageal reflux disease without esophagitis: Secondary | ICD-10-CM | POA: Diagnosis not present

## 2014-08-24 DIAGNOSIS — G4700 Insomnia, unspecified: Secondary | ICD-10-CM | POA: Diagnosis not present

## 2014-08-24 DIAGNOSIS — N183 Chronic kidney disease, stage 3 (moderate): Secondary | ICD-10-CM | POA: Diagnosis not present

## 2014-08-24 DIAGNOSIS — I10 Essential (primary) hypertension: Secondary | ICD-10-CM | POA: Diagnosis not present

## 2014-08-24 DIAGNOSIS — E782 Mixed hyperlipidemia: Secondary | ICD-10-CM | POA: Diagnosis not present

## 2014-08-24 DIAGNOSIS — C61 Malignant neoplasm of prostate: Secondary | ICD-10-CM | POA: Diagnosis not present

## 2014-08-24 DIAGNOSIS — J309 Allergic rhinitis, unspecified: Secondary | ICD-10-CM | POA: Diagnosis not present

## 2014-09-10 DIAGNOSIS — L578 Other skin changes due to chronic exposure to nonionizing radiation: Secondary | ICD-10-CM | POA: Diagnosis not present

## 2014-09-10 DIAGNOSIS — L570 Actinic keratosis: Secondary | ICD-10-CM | POA: Diagnosis not present

## 2014-09-10 DIAGNOSIS — D2272 Melanocytic nevi of left lower limb, including hip: Secondary | ICD-10-CM | POA: Diagnosis not present

## 2014-11-06 DIAGNOSIS — C61 Malignant neoplasm of prostate: Secondary | ICD-10-CM | POA: Diagnosis not present

## 2014-11-06 DIAGNOSIS — N528 Other male erectile dysfunction: Secondary | ICD-10-CM | POA: Diagnosis not present

## 2015-03-08 DIAGNOSIS — H10413 Chronic giant papillary conjunctivitis, bilateral: Secondary | ICD-10-CM | POA: Diagnosis not present

## 2015-03-08 DIAGNOSIS — H02834 Dermatochalasis of left upper eyelid: Secondary | ICD-10-CM | POA: Diagnosis not present

## 2015-03-08 DIAGNOSIS — H02831 Dermatochalasis of right upper eyelid: Secondary | ICD-10-CM | POA: Diagnosis not present

## 2015-03-08 DIAGNOSIS — H2511 Age-related nuclear cataract, right eye: Secondary | ICD-10-CM | POA: Diagnosis not present

## 2015-03-08 DIAGNOSIS — H04123 Dry eye syndrome of bilateral lacrimal glands: Secondary | ICD-10-CM | POA: Diagnosis not present

## 2015-03-08 DIAGNOSIS — Z961 Presence of intraocular lens: Secondary | ICD-10-CM | POA: Diagnosis not present

## 2015-03-08 DIAGNOSIS — H35372 Puckering of macula, left eye: Secondary | ICD-10-CM | POA: Diagnosis not present

## 2015-03-16 DIAGNOSIS — Z23 Encounter for immunization: Secondary | ICD-10-CM | POA: Diagnosis not present

## 2015-03-25 DIAGNOSIS — Z1211 Encounter for screening for malignant neoplasm of colon: Secondary | ICD-10-CM | POA: Diagnosis not present

## 2015-03-25 DIAGNOSIS — E782 Mixed hyperlipidemia: Secondary | ICD-10-CM | POA: Diagnosis not present

## 2015-03-25 DIAGNOSIS — N183 Chronic kidney disease, stage 3 (moderate): Secondary | ICD-10-CM | POA: Diagnosis not present

## 2015-03-25 DIAGNOSIS — I10 Essential (primary) hypertension: Secondary | ICD-10-CM | POA: Diagnosis not present

## 2015-03-25 DIAGNOSIS — Z1389 Encounter for screening for other disorder: Secondary | ICD-10-CM | POA: Diagnosis not present

## 2015-03-25 DIAGNOSIS — G4700 Insomnia, unspecified: Secondary | ICD-10-CM | POA: Diagnosis not present

## 2015-03-25 DIAGNOSIS — J069 Acute upper respiratory infection, unspecified: Secondary | ICD-10-CM | POA: Diagnosis not present

## 2015-03-25 DIAGNOSIS — J309 Allergic rhinitis, unspecified: Secondary | ICD-10-CM | POA: Diagnosis not present

## 2015-03-25 DIAGNOSIS — K219 Gastro-esophageal reflux disease without esophagitis: Secondary | ICD-10-CM | POA: Diagnosis not present

## 2015-03-25 DIAGNOSIS — Z0000 Encounter for general adult medical examination without abnormal findings: Secondary | ICD-10-CM | POA: Diagnosis not present

## 2015-03-25 DIAGNOSIS — C61 Malignant neoplasm of prostate: Secondary | ICD-10-CM | POA: Diagnosis not present

## 2015-03-28 DIAGNOSIS — Z9889 Other specified postprocedural states: Secondary | ICD-10-CM | POA: Diagnosis not present

## 2015-03-28 DIAGNOSIS — M47892 Other spondylosis, cervical region: Secondary | ICD-10-CM | POA: Diagnosis not present

## 2015-03-28 DIAGNOSIS — I10 Essential (primary) hypertension: Secondary | ICD-10-CM | POA: Diagnosis not present

## 2015-03-28 DIAGNOSIS — Z4789 Encounter for other orthopedic aftercare: Secondary | ICD-10-CM | POA: Diagnosis not present

## 2015-03-28 DIAGNOSIS — E782 Mixed hyperlipidemia: Secondary | ICD-10-CM | POA: Diagnosis present

## 2015-03-28 DIAGNOSIS — M25511 Pain in right shoulder: Secondary | ICD-10-CM | POA: Diagnosis not present

## 2015-03-28 DIAGNOSIS — M47812 Spondylosis without myelopathy or radiculopathy, cervical region: Secondary | ICD-10-CM | POA: Diagnosis not present

## 2015-03-28 DIAGNOSIS — M75101 Unspecified rotator cuff tear or rupture of right shoulder, not specified as traumatic: Secondary | ICD-10-CM | POA: Diagnosis not present

## 2015-03-28 DIAGNOSIS — Z87891 Personal history of nicotine dependence: Secondary | ICD-10-CM | POA: Diagnosis not present

## 2015-04-16 DIAGNOSIS — N183 Chronic kidney disease, stage 3 (moderate): Secondary | ICD-10-CM | POA: Diagnosis not present

## 2015-05-07 DIAGNOSIS — C61 Malignant neoplasm of prostate: Secondary | ICD-10-CM | POA: Diagnosis not present

## 2015-05-07 DIAGNOSIS — N528 Other male erectile dysfunction: Secondary | ICD-10-CM | POA: Diagnosis not present

## 2015-09-18 DIAGNOSIS — L578 Other skin changes due to chronic exposure to nonionizing radiation: Secondary | ICD-10-CM | POA: Diagnosis not present

## 2015-09-18 DIAGNOSIS — B351 Tinea unguium: Secondary | ICD-10-CM | POA: Diagnosis not present

## 2015-09-18 DIAGNOSIS — L821 Other seborrheic keratosis: Secondary | ICD-10-CM | POA: Diagnosis not present

## 2015-09-18 DIAGNOSIS — L570 Actinic keratosis: Secondary | ICD-10-CM | POA: Diagnosis not present

## 2015-09-23 DIAGNOSIS — I10 Essential (primary) hypertension: Secondary | ICD-10-CM | POA: Diagnosis not present

## 2015-09-23 DIAGNOSIS — C61 Malignant neoplasm of prostate: Secondary | ICD-10-CM | POA: Diagnosis not present

## 2015-09-23 DIAGNOSIS — K219 Gastro-esophageal reflux disease without esophagitis: Secondary | ICD-10-CM | POA: Diagnosis not present

## 2015-09-23 DIAGNOSIS — N183 Chronic kidney disease, stage 3 (moderate): Secondary | ICD-10-CM | POA: Diagnosis not present

## 2015-09-23 DIAGNOSIS — J309 Allergic rhinitis, unspecified: Secondary | ICD-10-CM | POA: Diagnosis not present

## 2015-09-23 DIAGNOSIS — E782 Mixed hyperlipidemia: Secondary | ICD-10-CM | POA: Diagnosis not present

## 2015-09-23 DIAGNOSIS — G4700 Insomnia, unspecified: Secondary | ICD-10-CM | POA: Diagnosis not present

## 2015-11-12 DIAGNOSIS — C61 Malignant neoplasm of prostate: Secondary | ICD-10-CM | POA: Diagnosis not present

## 2015-11-12 DIAGNOSIS — N5231 Erectile dysfunction following radical prostatectomy: Secondary | ICD-10-CM | POA: Diagnosis not present

## 2015-12-17 DIAGNOSIS — Z23 Encounter for immunization: Secondary | ICD-10-CM | POA: Diagnosis not present

## 2016-03-11 DIAGNOSIS — H26492 Other secondary cataract, left eye: Secondary | ICD-10-CM | POA: Diagnosis not present

## 2016-03-11 DIAGNOSIS — H04123 Dry eye syndrome of bilateral lacrimal glands: Secondary | ICD-10-CM | POA: Diagnosis not present

## 2016-03-11 DIAGNOSIS — Z961 Presence of intraocular lens: Secondary | ICD-10-CM | POA: Diagnosis not present

## 2016-03-11 DIAGNOSIS — H35372 Puckering of macula, left eye: Secondary | ICD-10-CM | POA: Diagnosis not present

## 2016-03-11 DIAGNOSIS — H2511 Age-related nuclear cataract, right eye: Secondary | ICD-10-CM | POA: Diagnosis not present

## 2016-05-22 ENCOUNTER — Other Ambulatory Visit: Payer: Self-pay | Admitting: Family Medicine

## 2016-05-22 DIAGNOSIS — Z1159 Encounter for screening for other viral diseases: Secondary | ICD-10-CM | POA: Diagnosis not present

## 2016-05-22 DIAGNOSIS — M25511 Pain in right shoulder: Secondary | ICD-10-CM | POA: Diagnosis not present

## 2016-05-22 DIAGNOSIS — I10 Essential (primary) hypertension: Secondary | ICD-10-CM | POA: Diagnosis not present

## 2016-05-22 DIAGNOSIS — N183 Chronic kidney disease, stage 3 (moderate): Secondary | ICD-10-CM | POA: Diagnosis not present

## 2016-05-22 DIAGNOSIS — Z0000 Encounter for general adult medical examination without abnormal findings: Secondary | ICD-10-CM | POA: Diagnosis not present

## 2016-05-22 DIAGNOSIS — C61 Malignant neoplasm of prostate: Secondary | ICD-10-CM | POA: Diagnosis not present

## 2016-05-22 DIAGNOSIS — Z136 Encounter for screening for cardiovascular disorders: Secondary | ICD-10-CM

## 2016-05-22 DIAGNOSIS — G4700 Insomnia, unspecified: Secondary | ICD-10-CM | POA: Diagnosis not present

## 2016-05-22 DIAGNOSIS — Z1389 Encounter for screening for other disorder: Secondary | ICD-10-CM | POA: Diagnosis not present

## 2016-05-22 DIAGNOSIS — K219 Gastro-esophageal reflux disease without esophagitis: Secondary | ICD-10-CM | POA: Diagnosis not present

## 2016-05-22 DIAGNOSIS — J309 Allergic rhinitis, unspecified: Secondary | ICD-10-CM | POA: Diagnosis not present

## 2016-05-22 DIAGNOSIS — E782 Mixed hyperlipidemia: Secondary | ICD-10-CM | POA: Diagnosis not present

## 2016-05-25 DIAGNOSIS — M67911 Unspecified disorder of synovium and tendon, right shoulder: Secondary | ICD-10-CM | POA: Diagnosis not present

## 2016-06-02 ENCOUNTER — Ambulatory Visit
Admission: RE | Admit: 2016-06-02 | Discharge: 2016-06-02 | Disposition: A | Discharge status: Home / Self Care | Payer: Medicare Other | Source: Ambulatory Visit | Attending: Family Medicine | Admitting: Family Medicine

## 2016-06-02 DIAGNOSIS — Z136 Encounter for screening for cardiovascular disorders: Secondary | ICD-10-CM | POA: Diagnosis not present

## 2016-06-02 DIAGNOSIS — Z87891 Personal history of nicotine dependence: Secondary | ICD-10-CM | POA: Diagnosis not present

## 2016-06-02 IMAGING — US US ABDOMINAL AORTA SCREENING AAA
1 series · 11 of 11 positions shown · non-contrast
Comparison: None.

CLINICAL DATA: Smoking history

EXAM:
ULTRASOUND OF ABDOMINAL AORTA
TECHNIQUE: Ultrasound examination of the abdominal aorta was performed to
evaluate for abdominal aortic aneurysm.

[Series 1: us abdominal aorta screening aaa · 0.20mm/px · 11 of 11 slices shown]
[im 1/11]
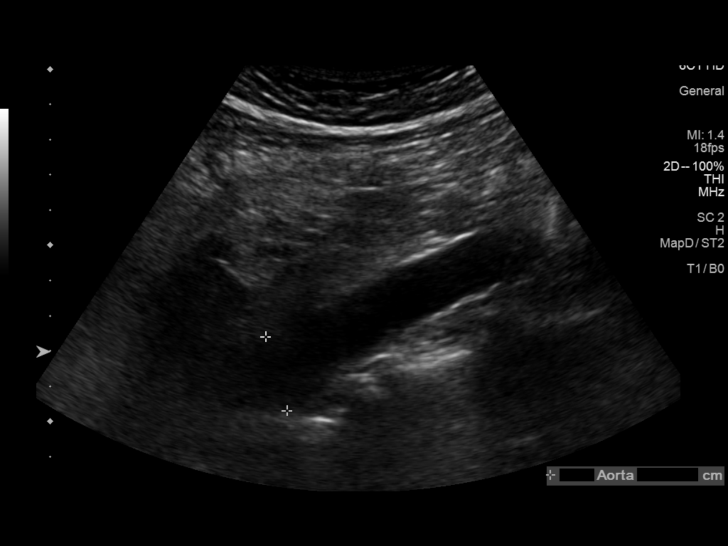
[im 2/11]
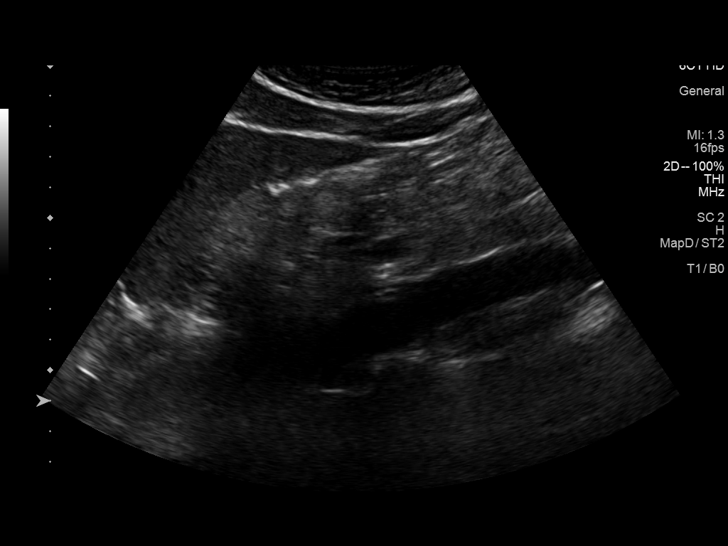
[im 3/11]
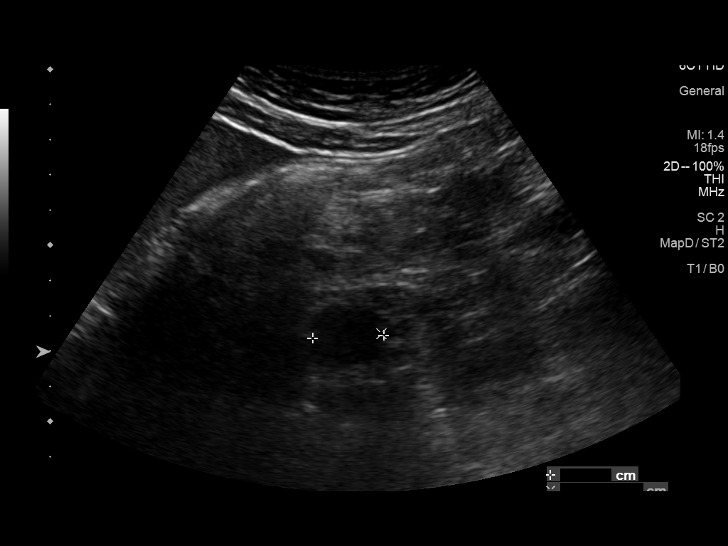
[im 4/11]
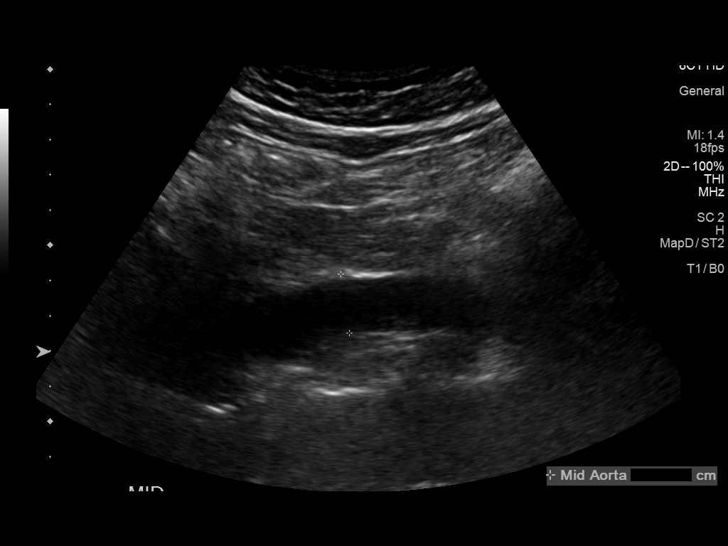
[im 5/11]
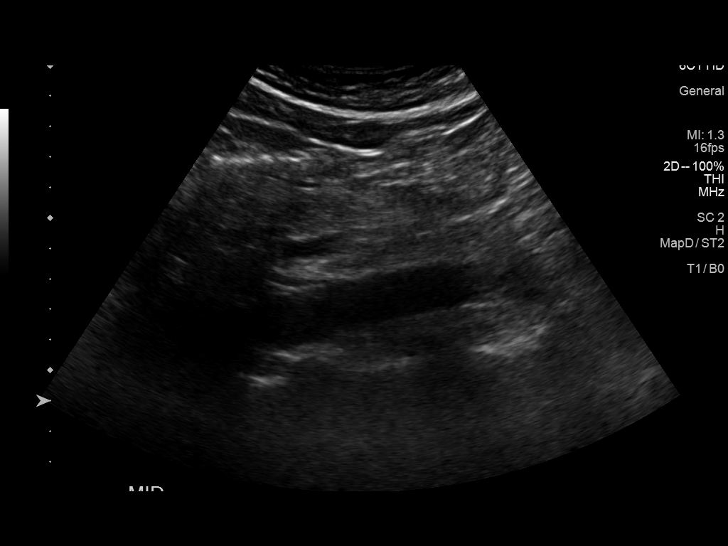
[im 6/11]
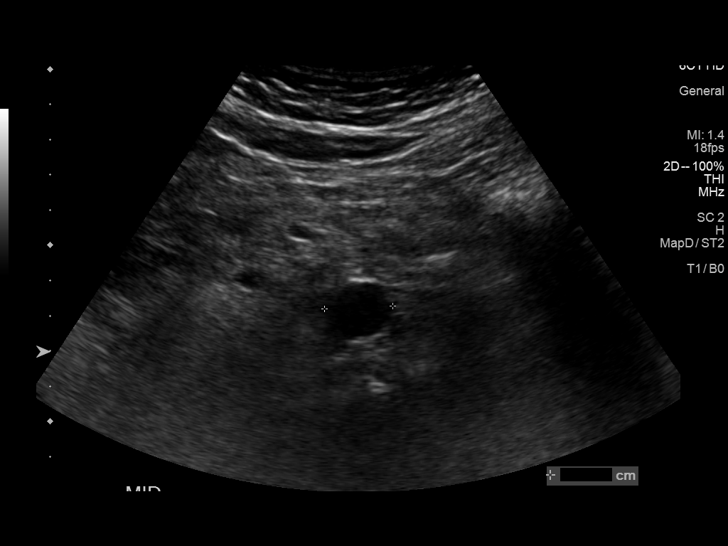
[im 7/11]
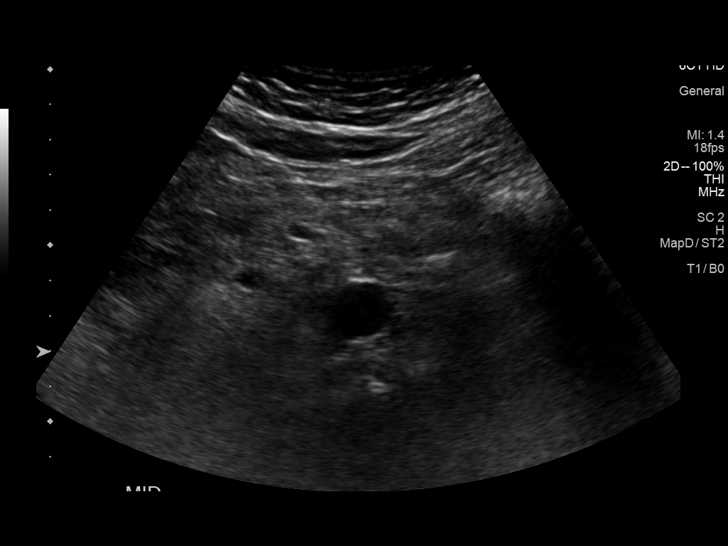
[im 8/11]
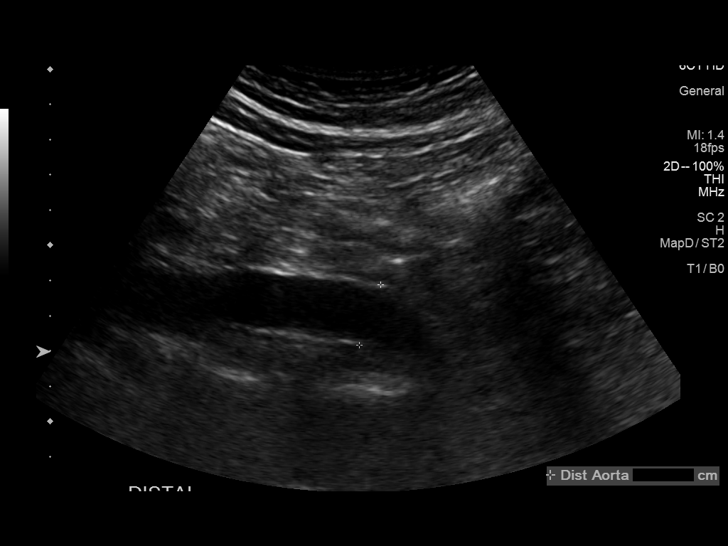
[im 9/11]
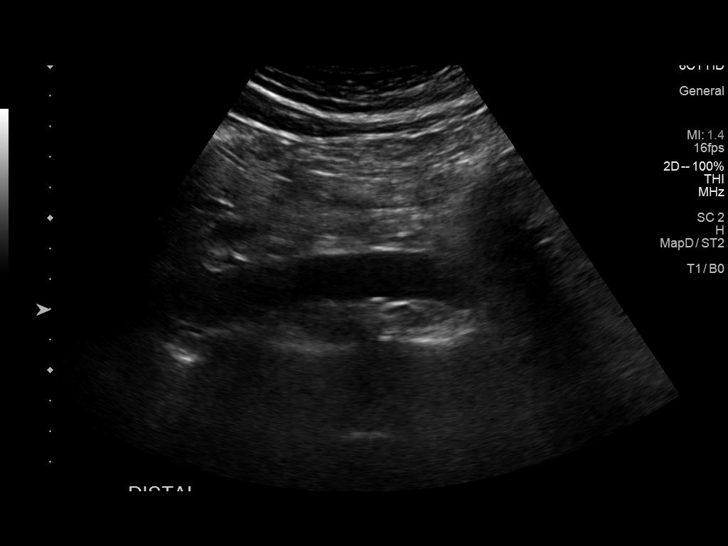
[im 10/11]
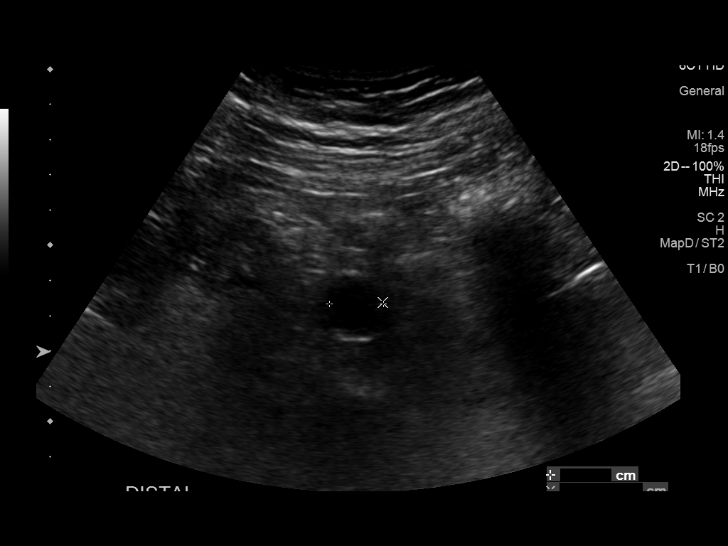
[im 11/11]
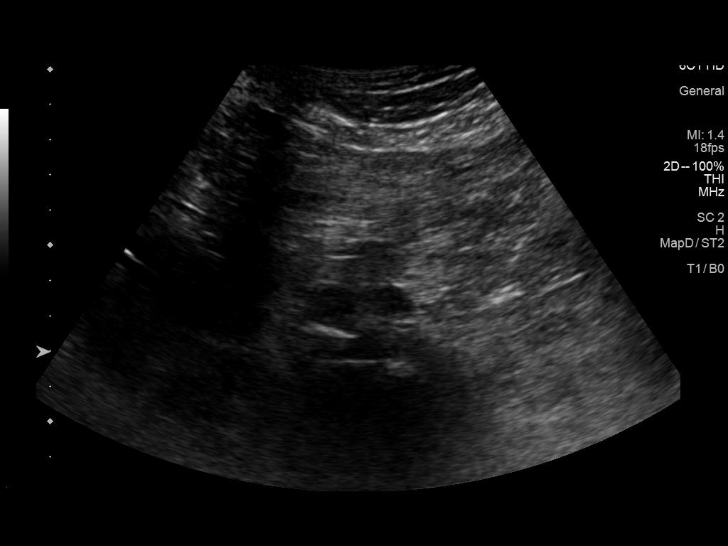

[11 of 11 positions shown; findings below may reference images not displayed]

FINDINGS: Abdominal Aorta

No aneurysm identified.

Maximum Diameter: 2.2 cm in the proximal abdomen
IMPRESSION: No evidence of aortic aneurysm.

## 2016-06-16 ENCOUNTER — Encounter: Payer: Self-pay | Admitting: *Deleted

## 2016-06-17 ENCOUNTER — Encounter: Payer: Self-pay | Admitting: Internal Medicine

## 2016-06-17 ENCOUNTER — Ambulatory Visit (INDEPENDENT_AMBULATORY_CARE_PROVIDER_SITE_OTHER): Payer: Medicare Other | Admitting: Internal Medicine

## 2016-06-17 VITALS — BP 140/80 | HR 82 | Ht 69.0 in | Wt 200.0 lb

## 2016-06-17 DIAGNOSIS — Z86010 Personal history of colonic polyps: Secondary | ICD-10-CM

## 2016-06-17 DIAGNOSIS — K5901 Slow transit constipation: Secondary | ICD-10-CM

## 2016-06-17 DIAGNOSIS — K627 Radiation proctitis: Secondary | ICD-10-CM

## 2016-06-17 MED ORDER — LINACLOTIDE 145 MCG PO CAPS: 145.0000 ug | ORAL_CAPSULE | Freq: Every day | ORAL | 6 refills | Status: DC

## 2016-06-17 NOTE — Progress Notes (Signed)
Subjective:    Patient ID: Kurt Hall, male    DOB: [birth year], 71 y.o.   MRN: [id number]  HPI Kurt Hall is a 71 year old male with a history of adenomatous colon polyps, previous GERD symptoms, history of small bowel obstruction status post LOA, prostate cancer status post prostatectomy and radiation therapy who is seen to evaluate constipation. He is here alone today.  Constipation had previously been an issue for him though after lysis of adhesions in 2012 the symptoms improved. Over the last several months constipation has been more of an issue as it was before. He reports bowel movements a small and at times hard. He often feels incomplete evacuation. This is associated with lower abdominal crampy pain which is worse at night and also bloating with flatulence. He tried MiraLAX without benefit as well as over-the-counter glycerin suppository. He's been using Dulcolax but this causes some minor increase in cramping and also urgency. He reports very rare rectal bleeding and denies melena and hematochezia. He is off of PPI therapy and having no heartburn. He denies dysphagia and odynophagia. He reports good appetite and stable weight.  He followed up recently with urology at [facility] his prostate cancer is felt to be in remission.  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 Five-year recall was recommended.   Review of Systems As per history of present illness, otherwise negative  Current Medications, Allergies, Past Medical History, Past Surgical History, Family History and Social History were reviewed in [employer] record.     Objective:   Physical Exam BP 140/80   Pulse 82   Ht 5\' 9"  (1.753 m)   Wt 200 lb (90.7 kg)   BMI 29.53 kg/m  Constitutional: Well-developed and well-nourished. No distress. HEENT: Normocephalic and atraumatic. Oropharynx is clear and  moist. Conjunctivae are normal.  No scleral icterus. Neck: Neck supple. Trachea midline. Cardiovascular: Normal rate, regular rhythm and intact distal pulses.  Pulmonary/chest: Effort normal and breath sounds normal. No wheezing, rales or rhonchi. Abdominal: Soft, nontender, nondistended. Bowel sounds active throughout. There are no masses palpable. No hepatosplenomegaly. Extremities: no clubbing, cyanosis, or edema Neurological: Alert and oriented to person place and time. Skin: Skin is warm and dry. Psychiatric: Normal mood and affect. Behavior is normal.      Assessment & Plan:  71 year old male with a history of adenomatous colon polyps, previous GERD symptoms, history of small bowel obstruction status post LOA, prostate cancer status post prostatectomy and radiation therapy who is seen to evaluate constipation.  1. Constipation -- this is been an on and off issue for him for years. He did not benefit from lesser and suppository nor MiraLAX. Will try Linzess 145 g daily. I asked that he let me know if he is having diarrhea or this medication is ineffective. He voices understanding and is happy with this plan.  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 Given infrequency I do not think he needs this ablated. He is in agreement. He will notify me if he has more frequent rectal bleeding  3. History of colon polyp -- colonoscopy for screening/surveillance is up-to-date. He is due repeat in December 2020  4. GERD -- no longer an issue and he is off PPI.  6 month follow-up, sooner if necessary 25 minutes spent with the patient today. Greater than  50% was spent in counseling and coordination of care with the patient

## 2016-06-17 NOTE — Patient Instructions (Signed)
We have sent the following medications to your pharmacy for you to pick up at your convenience:  [place]  Call the office if you are not having the results you want with this dose.  Please follow up in 6 months

## 2016-07-14 ENCOUNTER — Telehealth: Payer: Self-pay | Admitting: Internal Medicine

## 2016-07-14 MED ORDER — LINACLOTIDE 72 MCG PO CAPS: 72.0000 ug | ORAL_CAPSULE | Freq: Every day | ORAL | 3 refills | Status: DC

## 2016-07-14 NOTE — Telephone Encounter (Signed)
Yes, okay to reduce Linzess to 72 mcg daily Have him notify us if still urgent or uncontrollable stools with this med

## 2016-07-14 NOTE — Telephone Encounter (Signed)
Pt states he thinks he needs the lower dose of linzess. States when he takes the 167mcg dose he has to stay around close to the bathroom and can't go out. Please advise if ok to send script for Linzess 81mcg in for patient.

## 2016-07-14 NOTE — Telephone Encounter (Signed)
Script sent to the pharmacy, pt aware. 

## 2016-09-17 DIAGNOSIS — C4441 Basal cell carcinoma of skin of scalp and neck: Secondary | ICD-10-CM | POA: Diagnosis not present

## 2016-09-17 DIAGNOSIS — B351 Tinea unguium: Secondary | ICD-10-CM | POA: Diagnosis not present

## 2016-09-17 DIAGNOSIS — L570 Actinic keratosis: Secondary | ICD-10-CM | POA: Diagnosis not present

## 2016-11-05 DIAGNOSIS — J209 Acute bronchitis, unspecified: Secondary | ICD-10-CM | POA: Diagnosis not present

## 2016-11-05 DIAGNOSIS — J0100 Acute maxillary sinusitis, unspecified: Secondary | ICD-10-CM | POA: Diagnosis not present

## 2016-11-05 DIAGNOSIS — R509 Fever, unspecified: Secondary | ICD-10-CM | POA: Diagnosis not present

## 2016-11-10 DIAGNOSIS — C61 Malignant neoplasm of prostate: Secondary | ICD-10-CM | POA: Diagnosis not present

## 2016-11-10 DIAGNOSIS — N5231 Erectile dysfunction following radical prostatectomy: Secondary | ICD-10-CM | POA: Diagnosis not present

## 2016-11-20 DIAGNOSIS — R05 Cough: Secondary | ICD-10-CM | POA: Diagnosis not present

## 2016-12-24 DIAGNOSIS — Z23 Encounter for immunization: Secondary | ICD-10-CM | POA: Diagnosis not present

## 2017-01-20 DIAGNOSIS — S61231A Puncture wound without foreign body of left index finger without damage to nail, initial encounter: Secondary | ICD-10-CM | POA: Diagnosis not present

## 2017-01-29 DIAGNOSIS — Z23 Encounter for immunization: Secondary | ICD-10-CM | POA: Diagnosis not present

## 2017-01-29 DIAGNOSIS — K219 Gastro-esophageal reflux disease without esophagitis: Secondary | ICD-10-CM | POA: Diagnosis not present

## 2017-01-29 DIAGNOSIS — J309 Allergic rhinitis, unspecified: Secondary | ICD-10-CM | POA: Diagnosis not present

## 2017-01-29 DIAGNOSIS — G4700 Insomnia, unspecified: Secondary | ICD-10-CM | POA: Diagnosis not present

## 2017-01-29 DIAGNOSIS — M545 Low back pain: Secondary | ICD-10-CM | POA: Diagnosis not present

## 2017-01-29 DIAGNOSIS — E782 Mixed hyperlipidemia: Secondary | ICD-10-CM | POA: Diagnosis not present

## 2017-01-29 DIAGNOSIS — Z6831 Body mass index (BMI) 31.0-31.9, adult: Secondary | ICD-10-CM | POA: Diagnosis not present

## 2017-01-29 DIAGNOSIS — C61 Malignant neoplasm of prostate: Secondary | ICD-10-CM | POA: Diagnosis not present

## 2017-01-29 DIAGNOSIS — I10 Essential (primary) hypertension: Secondary | ICD-10-CM | POA: Diagnosis not present

## 2017-01-29 DIAGNOSIS — N183 Chronic kidney disease, stage 3 (moderate): Secondary | ICD-10-CM | POA: Diagnosis not present

## 2017-03-07 DIAGNOSIS — L03032 Cellulitis of left toe: Secondary | ICD-10-CM | POA: Diagnosis not present

## 2017-03-11 DIAGNOSIS — H26492 Other secondary cataract, left eye: Secondary | ICD-10-CM | POA: Diagnosis not present

## 2017-03-11 DIAGNOSIS — H35372 Puckering of macula, left eye: Secondary | ICD-10-CM | POA: Diagnosis not present

## 2017-03-11 DIAGNOSIS — Z961 Presence of intraocular lens: Secondary | ICD-10-CM | POA: Diagnosis not present

## 2017-03-11 DIAGNOSIS — H25811 Combined forms of age-related cataract, right eye: Secondary | ICD-10-CM | POA: Diagnosis not present

## 2017-03-18 DIAGNOSIS — M5412 Radiculopathy, cervical region: Secondary | ICD-10-CM | POA: Diagnosis not present

## 2017-03-18 DIAGNOSIS — Z6830 Body mass index (BMI) 30.0-30.9, adult: Secondary | ICD-10-CM | POA: Diagnosis not present

## 2017-03-18 DIAGNOSIS — I10 Essential (primary) hypertension: Secondary | ICD-10-CM | POA: Diagnosis not present

## 2017-03-25 DIAGNOSIS — M5412 Radiculopathy, cervical region: Secondary | ICD-10-CM | POA: Diagnosis not present

## 2017-03-25 DIAGNOSIS — M542 Cervicalgia: Secondary | ICD-10-CM | POA: Diagnosis not present

## 2017-03-30 DIAGNOSIS — M5412 Radiculopathy, cervical region: Secondary | ICD-10-CM | POA: Diagnosis not present

## 2017-04-09 DIAGNOSIS — M545 Low back pain: Secondary | ICD-10-CM | POA: Diagnosis not present

## 2017-04-14 DIAGNOSIS — M25612 Stiffness of left shoulder, not elsewhere classified: Secondary | ICD-10-CM | POA: Diagnosis not present

## 2017-04-14 DIAGNOSIS — M25611 Stiffness of right shoulder, not elsewhere classified: Secondary | ICD-10-CM | POA: Diagnosis not present

## 2017-04-14 DIAGNOSIS — M5412 Radiculopathy, cervical region: Secondary | ICD-10-CM | POA: Diagnosis not present

## 2017-04-14 DIAGNOSIS — M5489 Other dorsalgia: Secondary | ICD-10-CM | POA: Diagnosis not present

## 2017-04-14 DIAGNOSIS — M6281 Muscle weakness (generalized): Secondary | ICD-10-CM | POA: Diagnosis not present

## 2017-04-14 DIAGNOSIS — M542 Cervicalgia: Secondary | ICD-10-CM | POA: Diagnosis not present

## 2017-04-14 DIAGNOSIS — R293 Abnormal posture: Secondary | ICD-10-CM | POA: Diagnosis not present

## 2017-04-14 DIAGNOSIS — M2560 Stiffness of unspecified joint, not elsewhere classified: Secondary | ICD-10-CM | POA: Diagnosis not present

## 2017-04-16 DIAGNOSIS — M5412 Radiculopathy, cervical region: Secondary | ICD-10-CM | POA: Diagnosis not present

## 2017-04-16 DIAGNOSIS — R293 Abnormal posture: Secondary | ICD-10-CM | POA: Diagnosis not present

## 2017-04-16 DIAGNOSIS — M2560 Stiffness of unspecified joint, not elsewhere classified: Secondary | ICD-10-CM | POA: Diagnosis not present

## 2017-04-16 DIAGNOSIS — M5489 Other dorsalgia: Secondary | ICD-10-CM | POA: Diagnosis not present

## 2017-04-16 DIAGNOSIS — M6281 Muscle weakness (generalized): Secondary | ICD-10-CM | POA: Diagnosis not present

## 2017-04-16 DIAGNOSIS — M542 Cervicalgia: Secondary | ICD-10-CM | POA: Diagnosis not present

## 2017-04-20 DIAGNOSIS — M542 Cervicalgia: Secondary | ICD-10-CM | POA: Diagnosis not present

## 2017-04-20 DIAGNOSIS — M5489 Other dorsalgia: Secondary | ICD-10-CM | POA: Diagnosis not present

## 2017-04-20 DIAGNOSIS — R293 Abnormal posture: Secondary | ICD-10-CM | POA: Diagnosis not present

## 2017-04-20 DIAGNOSIS — M2560 Stiffness of unspecified joint, not elsewhere classified: Secondary | ICD-10-CM | POA: Diagnosis not present

## 2017-04-20 DIAGNOSIS — M6281 Muscle weakness (generalized): Secondary | ICD-10-CM | POA: Diagnosis not present

## 2017-04-20 DIAGNOSIS — M5412 Radiculopathy, cervical region: Secondary | ICD-10-CM | POA: Diagnosis not present

## 2017-04-23 DIAGNOSIS — R293 Abnormal posture: Secondary | ICD-10-CM | POA: Diagnosis not present

## 2017-04-23 DIAGNOSIS — M542 Cervicalgia: Secondary | ICD-10-CM | POA: Diagnosis not present

## 2017-04-23 DIAGNOSIS — M2560 Stiffness of unspecified joint, not elsewhere classified: Secondary | ICD-10-CM | POA: Diagnosis not present

## 2017-04-23 DIAGNOSIS — M6281 Muscle weakness (generalized): Secondary | ICD-10-CM | POA: Diagnosis not present

## 2017-04-23 DIAGNOSIS — M5412 Radiculopathy, cervical region: Secondary | ICD-10-CM | POA: Diagnosis not present

## 2017-04-23 DIAGNOSIS — M5489 Other dorsalgia: Secondary | ICD-10-CM | POA: Diagnosis not present

## 2017-04-24 DIAGNOSIS — N3091 Cystitis, unspecified with hematuria: Secondary | ICD-10-CM | POA: Diagnosis not present

## 2017-04-26 DIAGNOSIS — M542 Cervicalgia: Secondary | ICD-10-CM | POA: Diagnosis not present

## 2017-04-26 DIAGNOSIS — M5489 Other dorsalgia: Secondary | ICD-10-CM | POA: Diagnosis not present

## 2017-04-26 DIAGNOSIS — M6281 Muscle weakness (generalized): Secondary | ICD-10-CM | POA: Diagnosis not present

## 2017-04-26 DIAGNOSIS — M2560 Stiffness of unspecified joint, not elsewhere classified: Secondary | ICD-10-CM | POA: Diagnosis not present

## 2017-04-26 DIAGNOSIS — M5412 Radiculopathy, cervical region: Secondary | ICD-10-CM | POA: Diagnosis not present

## 2017-04-26 DIAGNOSIS — R293 Abnormal posture: Secondary | ICD-10-CM | POA: Diagnosis not present

## 2017-04-28 DIAGNOSIS — M5489 Other dorsalgia: Secondary | ICD-10-CM | POA: Diagnosis not present

## 2017-04-28 DIAGNOSIS — M5412 Radiculopathy, cervical region: Secondary | ICD-10-CM | POA: Diagnosis not present

## 2017-04-28 DIAGNOSIS — M6281 Muscle weakness (generalized): Secondary | ICD-10-CM | POA: Diagnosis not present

## 2017-04-28 DIAGNOSIS — R293 Abnormal posture: Secondary | ICD-10-CM | POA: Diagnosis not present

## 2017-04-28 DIAGNOSIS — M2560 Stiffness of unspecified joint, not elsewhere classified: Secondary | ICD-10-CM | POA: Diagnosis not present

## 2017-04-28 DIAGNOSIS — M542 Cervicalgia: Secondary | ICD-10-CM | POA: Diagnosis not present

## 2017-05-04 DIAGNOSIS — M542 Cervicalgia: Secondary | ICD-10-CM | POA: Diagnosis not present

## 2017-05-04 DIAGNOSIS — M2560 Stiffness of unspecified joint, not elsewhere classified: Secondary | ICD-10-CM | POA: Diagnosis not present

## 2017-05-04 DIAGNOSIS — M6281 Muscle weakness (generalized): Secondary | ICD-10-CM | POA: Diagnosis not present

## 2017-05-04 DIAGNOSIS — R293 Abnormal posture: Secondary | ICD-10-CM | POA: Diagnosis not present

## 2017-05-04 DIAGNOSIS — M5489 Other dorsalgia: Secondary | ICD-10-CM | POA: Diagnosis not present

## 2017-05-04 DIAGNOSIS — M5412 Radiculopathy, cervical region: Secondary | ICD-10-CM | POA: Diagnosis not present

## 2017-05-07 DIAGNOSIS — M5412 Radiculopathy, cervical region: Secondary | ICD-10-CM | POA: Diagnosis not present

## 2017-05-07 DIAGNOSIS — R293 Abnormal posture: Secondary | ICD-10-CM | POA: Diagnosis not present

## 2017-05-07 DIAGNOSIS — M2560 Stiffness of unspecified joint, not elsewhere classified: Secondary | ICD-10-CM | POA: Diagnosis not present

## 2017-05-07 DIAGNOSIS — M5489 Other dorsalgia: Secondary | ICD-10-CM | POA: Diagnosis not present

## 2017-05-07 DIAGNOSIS — M542 Cervicalgia: Secondary | ICD-10-CM | POA: Diagnosis not present

## 2017-05-07 DIAGNOSIS — M6281 Muscle weakness (generalized): Secondary | ICD-10-CM | POA: Diagnosis not present

## 2017-05-10 DIAGNOSIS — M5489 Other dorsalgia: Secondary | ICD-10-CM | POA: Diagnosis not present

## 2017-05-10 DIAGNOSIS — M5412 Radiculopathy, cervical region: Secondary | ICD-10-CM | POA: Diagnosis not present

## 2017-05-10 DIAGNOSIS — M25611 Stiffness of right shoulder, not elsewhere classified: Secondary | ICD-10-CM | POA: Diagnosis not present

## 2017-05-10 DIAGNOSIS — M6281 Muscle weakness (generalized): Secondary | ICD-10-CM | POA: Diagnosis not present

## 2017-05-10 DIAGNOSIS — M542 Cervicalgia: Secondary | ICD-10-CM | POA: Diagnosis not present

## 2017-05-10 DIAGNOSIS — M25612 Stiffness of left shoulder, not elsewhere classified: Secondary | ICD-10-CM | POA: Diagnosis not present

## 2017-05-10 DIAGNOSIS — M2560 Stiffness of unspecified joint, not elsewhere classified: Secondary | ICD-10-CM | POA: Diagnosis not present

## 2017-05-10 DIAGNOSIS — R293 Abnormal posture: Secondary | ICD-10-CM | POA: Diagnosis not present

## 2017-05-14 DIAGNOSIS — R293 Abnormal posture: Secondary | ICD-10-CM | POA: Diagnosis not present

## 2017-05-14 DIAGNOSIS — M542 Cervicalgia: Secondary | ICD-10-CM | POA: Diagnosis not present

## 2017-05-14 DIAGNOSIS — M2560 Stiffness of unspecified joint, not elsewhere classified: Secondary | ICD-10-CM | POA: Diagnosis not present

## 2017-05-14 DIAGNOSIS — M6281 Muscle weakness (generalized): Secondary | ICD-10-CM | POA: Diagnosis not present

## 2017-05-14 DIAGNOSIS — M5489 Other dorsalgia: Secondary | ICD-10-CM | POA: Diagnosis not present

## 2017-05-14 DIAGNOSIS — M5412 Radiculopathy, cervical region: Secondary | ICD-10-CM | POA: Diagnosis not present

## 2017-05-21 DIAGNOSIS — M542 Cervicalgia: Secondary | ICD-10-CM | POA: Diagnosis not present

## 2017-05-21 DIAGNOSIS — M6281 Muscle weakness (generalized): Secondary | ICD-10-CM | POA: Diagnosis not present

## 2017-05-21 DIAGNOSIS — M2560 Stiffness of unspecified joint, not elsewhere classified: Secondary | ICD-10-CM | POA: Diagnosis not present

## 2017-05-21 DIAGNOSIS — M5489 Other dorsalgia: Secondary | ICD-10-CM | POA: Diagnosis not present

## 2017-05-21 DIAGNOSIS — M5412 Radiculopathy, cervical region: Secondary | ICD-10-CM | POA: Diagnosis not present

## 2017-05-21 DIAGNOSIS — R293 Abnormal posture: Secondary | ICD-10-CM | POA: Diagnosis not present

## 2017-05-24 DIAGNOSIS — M542 Cervicalgia: Secondary | ICD-10-CM | POA: Diagnosis not present

## 2017-05-24 DIAGNOSIS — M6281 Muscle weakness (generalized): Secondary | ICD-10-CM | POA: Diagnosis not present

## 2017-05-24 DIAGNOSIS — M5489 Other dorsalgia: Secondary | ICD-10-CM | POA: Diagnosis not present

## 2017-05-24 DIAGNOSIS — M5412 Radiculopathy, cervical region: Secondary | ICD-10-CM | POA: Diagnosis not present

## 2017-05-24 DIAGNOSIS — R293 Abnormal posture: Secondary | ICD-10-CM | POA: Diagnosis not present

## 2017-05-24 DIAGNOSIS — M2560 Stiffness of unspecified joint, not elsewhere classified: Secondary | ICD-10-CM | POA: Diagnosis not present

## 2017-05-25 DIAGNOSIS — M542 Cervicalgia: Secondary | ICD-10-CM | POA: Diagnosis not present

## 2017-05-25 DIAGNOSIS — R293 Abnormal posture: Secondary | ICD-10-CM | POA: Diagnosis not present

## 2017-05-25 DIAGNOSIS — M2560 Stiffness of unspecified joint, not elsewhere classified: Secondary | ICD-10-CM | POA: Diagnosis not present

## 2017-05-25 DIAGNOSIS — M5412 Radiculopathy, cervical region: Secondary | ICD-10-CM | POA: Diagnosis not present

## 2017-05-25 DIAGNOSIS — M6281 Muscle weakness (generalized): Secondary | ICD-10-CM | POA: Diagnosis not present

## 2017-05-25 DIAGNOSIS — M5489 Other dorsalgia: Secondary | ICD-10-CM | POA: Diagnosis not present

## 2017-06-01 DIAGNOSIS — R293 Abnormal posture: Secondary | ICD-10-CM | POA: Diagnosis not present

## 2017-06-01 DIAGNOSIS — M6281 Muscle weakness (generalized): Secondary | ICD-10-CM | POA: Diagnosis not present

## 2017-06-01 DIAGNOSIS — M2560 Stiffness of unspecified joint, not elsewhere classified: Secondary | ICD-10-CM | POA: Diagnosis not present

## 2017-06-01 DIAGNOSIS — M5412 Radiculopathy, cervical region: Secondary | ICD-10-CM | POA: Diagnosis not present

## 2017-06-01 DIAGNOSIS — M542 Cervicalgia: Secondary | ICD-10-CM | POA: Diagnosis not present

## 2017-06-01 DIAGNOSIS — M5489 Other dorsalgia: Secondary | ICD-10-CM | POA: Diagnosis not present

## 2017-06-04 DIAGNOSIS — R293 Abnormal posture: Secondary | ICD-10-CM | POA: Diagnosis not present

## 2017-06-04 DIAGNOSIS — M2560 Stiffness of unspecified joint, not elsewhere classified: Secondary | ICD-10-CM | POA: Diagnosis not present

## 2017-06-04 DIAGNOSIS — M542 Cervicalgia: Secondary | ICD-10-CM | POA: Diagnosis not present

## 2017-06-04 DIAGNOSIS — M5489 Other dorsalgia: Secondary | ICD-10-CM | POA: Diagnosis not present

## 2017-06-04 DIAGNOSIS — M5412 Radiculopathy, cervical region: Secondary | ICD-10-CM | POA: Diagnosis not present

## 2017-06-04 DIAGNOSIS — M6281 Muscle weakness (generalized): Secondary | ICD-10-CM | POA: Diagnosis not present

## 2017-06-07 DIAGNOSIS — R293 Abnormal posture: Secondary | ICD-10-CM | POA: Diagnosis not present

## 2017-06-07 DIAGNOSIS — M542 Cervicalgia: Secondary | ICD-10-CM | POA: Diagnosis not present

## 2017-06-07 DIAGNOSIS — M6281 Muscle weakness (generalized): Secondary | ICD-10-CM | POA: Diagnosis not present

## 2017-06-07 DIAGNOSIS — M5412 Radiculopathy, cervical region: Secondary | ICD-10-CM | POA: Diagnosis not present

## 2017-06-07 DIAGNOSIS — M2560 Stiffness of unspecified joint, not elsewhere classified: Secondary | ICD-10-CM | POA: Diagnosis not present

## 2017-06-07 DIAGNOSIS — M5489 Other dorsalgia: Secondary | ICD-10-CM | POA: Diagnosis not present

## 2017-06-09 DIAGNOSIS — M2560 Stiffness of unspecified joint, not elsewhere classified: Secondary | ICD-10-CM | POA: Diagnosis not present

## 2017-06-09 DIAGNOSIS — M25612 Stiffness of left shoulder, not elsewhere classified: Secondary | ICD-10-CM | POA: Diagnosis not present

## 2017-06-09 DIAGNOSIS — R293 Abnormal posture: Secondary | ICD-10-CM | POA: Diagnosis not present

## 2017-06-09 DIAGNOSIS — M6281 Muscle weakness (generalized): Secondary | ICD-10-CM | POA: Diagnosis not present

## 2017-06-09 DIAGNOSIS — M25611 Stiffness of right shoulder, not elsewhere classified: Secondary | ICD-10-CM | POA: Diagnosis not present

## 2017-06-09 DIAGNOSIS — M5412 Radiculopathy, cervical region: Secondary | ICD-10-CM | POA: Diagnosis not present

## 2017-06-09 DIAGNOSIS — M542 Cervicalgia: Secondary | ICD-10-CM | POA: Diagnosis not present

## 2017-06-09 DIAGNOSIS — M5489 Other dorsalgia: Secondary | ICD-10-CM | POA: Diagnosis not present

## 2017-06-11 DIAGNOSIS — M5412 Radiculopathy, cervical region: Secondary | ICD-10-CM | POA: Diagnosis not present

## 2017-06-21 DIAGNOSIS — B351 Tinea unguium: Secondary | ICD-10-CM | POA: Diagnosis not present

## 2017-06-21 DIAGNOSIS — L570 Actinic keratosis: Secondary | ICD-10-CM | POA: Diagnosis not present

## 2017-06-21 DIAGNOSIS — L578 Other skin changes due to chronic exposure to nonionizing radiation: Secondary | ICD-10-CM | POA: Diagnosis not present

## 2017-09-24 DIAGNOSIS — N183 Chronic kidney disease, stage 3 (moderate): Secondary | ICD-10-CM | POA: Diagnosis not present

## 2017-09-24 DIAGNOSIS — G4700 Insomnia, unspecified: Secondary | ICD-10-CM | POA: Diagnosis not present

## 2017-09-24 DIAGNOSIS — C61 Malignant neoplasm of prostate: Secondary | ICD-10-CM | POA: Diagnosis not present

## 2017-09-24 DIAGNOSIS — J309 Allergic rhinitis, unspecified: Secondary | ICD-10-CM | POA: Diagnosis not present

## 2017-09-24 DIAGNOSIS — Z1211 Encounter for screening for malignant neoplasm of colon: Secondary | ICD-10-CM | POA: Diagnosis not present

## 2017-09-24 DIAGNOSIS — Z1389 Encounter for screening for other disorder: Secondary | ICD-10-CM | POA: Diagnosis not present

## 2017-09-24 DIAGNOSIS — M5031 Other cervical disc degeneration,  high cervical region: Secondary | ICD-10-CM | POA: Diagnosis not present

## 2017-09-24 DIAGNOSIS — K219 Gastro-esophageal reflux disease without esophagitis: Secondary | ICD-10-CM | POA: Diagnosis not present

## 2017-09-24 DIAGNOSIS — Z0000 Encounter for general adult medical examination without abnormal findings: Secondary | ICD-10-CM | POA: Diagnosis not present

## 2017-09-24 DIAGNOSIS — I10 Essential (primary) hypertension: Secondary | ICD-10-CM | POA: Diagnosis not present

## 2017-09-24 DIAGNOSIS — E782 Mixed hyperlipidemia: Secondary | ICD-10-CM | POA: Diagnosis not present

## 2017-11-12 DIAGNOSIS — C61 Malignant neoplasm of prostate: Secondary | ICD-10-CM | POA: Diagnosis not present

## 2017-11-12 DIAGNOSIS — N5231 Erectile dysfunction following radical prostatectomy: Secondary | ICD-10-CM | POA: Diagnosis not present

## 2017-12-17 DIAGNOSIS — Z23 Encounter for immunization: Secondary | ICD-10-CM | POA: Diagnosis not present

## 2018-03-28 DIAGNOSIS — L570 Actinic keratosis: Secondary | ICD-10-CM | POA: Diagnosis not present

## 2018-03-28 DIAGNOSIS — B351 Tinea unguium: Secondary | ICD-10-CM | POA: Diagnosis not present

## 2018-03-31 DIAGNOSIS — N183 Chronic kidney disease, stage 3 (moderate): Secondary | ICD-10-CM | POA: Diagnosis not present

## 2018-03-31 DIAGNOSIS — M5031 Other cervical disc degeneration,  high cervical region: Secondary | ICD-10-CM | POA: Diagnosis not present

## 2018-03-31 DIAGNOSIS — C61 Malignant neoplasm of prostate: Secondary | ICD-10-CM | POA: Diagnosis not present

## 2018-03-31 DIAGNOSIS — G4700 Insomnia, unspecified: Secondary | ICD-10-CM | POA: Diagnosis not present

## 2018-03-31 DIAGNOSIS — K219 Gastro-esophageal reflux disease without esophagitis: Secondary | ICD-10-CM | POA: Diagnosis not present

## 2018-03-31 DIAGNOSIS — J309 Allergic rhinitis, unspecified: Secondary | ICD-10-CM | POA: Diagnosis not present

## 2018-03-31 DIAGNOSIS — I10 Essential (primary) hypertension: Secondary | ICD-10-CM | POA: Diagnosis not present

## 2018-03-31 DIAGNOSIS — E782 Mixed hyperlipidemia: Secondary | ICD-10-CM | POA: Diagnosis not present

## 2018-05-18 DIAGNOSIS — R1033 Periumbilical pain: Secondary | ICD-10-CM | POA: Diagnosis not present

## 2018-10-06 DIAGNOSIS — E782 Mixed hyperlipidemia: Secondary | ICD-10-CM | POA: Diagnosis not present

## 2018-10-06 DIAGNOSIS — I10 Essential (primary) hypertension: Secondary | ICD-10-CM | POA: Diagnosis not present

## 2018-10-06 DIAGNOSIS — G4700 Insomnia, unspecified: Secondary | ICD-10-CM | POA: Diagnosis not present

## 2018-10-06 DIAGNOSIS — Z0000 Encounter for general adult medical examination without abnormal findings: Secondary | ICD-10-CM | POA: Diagnosis not present

## 2018-10-28 DIAGNOSIS — E782 Mixed hyperlipidemia: Secondary | ICD-10-CM | POA: Diagnosis not present

## 2018-10-28 DIAGNOSIS — Z23 Encounter for immunization: Secondary | ICD-10-CM | POA: Diagnosis not present

## 2018-10-28 DIAGNOSIS — N184 Chronic kidney disease, stage 4 (severe): Secondary | ICD-10-CM | POA: Diagnosis not present

## 2018-11-17 DIAGNOSIS — N184 Chronic kidney disease, stage 4 (severe): Secondary | ICD-10-CM | POA: Diagnosis not present

## 2018-11-29 ENCOUNTER — Other Ambulatory Visit: Payer: Self-pay

## 2018-11-29 ENCOUNTER — Encounter: Payer: Self-pay | Admitting: Nurse Practitioner

## 2018-11-29 ENCOUNTER — Ambulatory Visit: Payer: Medicare Other | Admitting: Nurse Practitioner

## 2018-11-29 ENCOUNTER — Other Ambulatory Visit (INDEPENDENT_AMBULATORY_CARE_PROVIDER_SITE_OTHER): Payer: Medicare Other

## 2018-11-29 VITALS — BP 118/62 | HR 75 | Ht 69.0 in | Wt 196.8 lb

## 2018-11-29 DIAGNOSIS — K5909 Other constipation: Secondary | ICD-10-CM | POA: Diagnosis not present

## 2018-11-29 DIAGNOSIS — R1010 Upper abdominal pain, unspecified: Secondary | ICD-10-CM

## 2018-11-29 DIAGNOSIS — R634 Abnormal weight loss: Secondary | ICD-10-CM

## 2018-11-29 DIAGNOSIS — Z860100 Personal history of colon polyps, unspecified: Secondary | ICD-10-CM

## 2018-11-29 DIAGNOSIS — Z1159 Encounter for screening for other viral diseases: Secondary | ICD-10-CM

## 2018-11-29 LAB — COMPREHENSIVE METABOLIC PANEL
ALT: 21 U/L (ref 0–53)
AST: 19 U/L (ref 0–37)
Albumin: 4.7 g/dL (ref 3.5–5.2)
Alkaline Phosphatase: 59 U/L (ref 39–117)
BUN: 36 mg/dL — ABNORMAL HIGH (ref 6–23)
CO2: 25 mEq/L (ref 19–32)
Calcium: 9.3 mg/dL (ref 8.4–10.5)
Chloride: 100 mEq/L (ref 96–112)
Creatinine, Ser: 2.41 mg/dL — ABNORMAL HIGH (ref 0.40–1.50)
GFR: 26.5 mL/min — ABNORMAL LOW (ref >60.00)
Glucose, Bld: 106 mg/dL — ABNORMAL HIGH (ref 70–99)
Potassium: 4 mEq/L (ref 3.5–5.1)
Sodium: 135 mEq/L (ref 135–145)
Total Bilirubin: 1 mg/dL (ref 0.2–1.2)
Total Protein: 7.5 g/dL (ref 6.0–8.3)

## 2018-11-29 LAB — LIPASE: Lipase: 71 U/L — ABNORMAL HIGH (ref 11.0–59.0)

## 2018-11-29 MED ORDER — NA SULFATE-K SULFATE-MG SULF 17.5-3.13-1.6 GM/177ML PO SOLN: ORAL | 0 refills | Status: DC

## 2018-11-29 MED ORDER — OMEPRAZOLE 20 MG PO CPDR: 20.0000 mg | DELAYED_RELEASE_CAPSULE | Freq: Every day | ORAL | 2 refills | Status: DC

## 2018-11-29 MED ORDER — LUBIPROSTONE 24 MCG PO CAPS: 24.0000 ug | ORAL_CAPSULE | Freq: Two times a day (BID) | ORAL | 2 refills | Status: DC

## 2018-11-29 NOTE — Progress Notes (Signed)
Chief Complaint:    Constipation and  Upper abdominal pain  IMPRESSION and PLAN:    1. 73 yo male with chronic constipation, refractory after a while to Miralax, fiber and Linzess.  - Trial of Amitiza 24 mg twice daily, if insurance will cover it.  -Advised against use of magnesium citrate in setting of CKD  2. Crampy mid upper abdominal pain with associated 10 pound weight loss -Obtain labs including c-Met, CBC and lipase -Will schedule for EGD to be done at time of surveillance colonoscopy. The risks and benefits of EGD were discussed and the patient agrees to proceed.   3. History of colon polyps, due for surveillance colonoscopy December 2020. -Will go ahead and schedule for colonoscopy while patient is here to sign paperwork. The risks and benefits of colonoscopy with possible polypectomy / biopsies were discussed and the patient agrees to proceed.     HPI:     Patient is a 73 year old male with PMH significant for prostate cancer status post prostatectomy and radiation therapy, history of adenomatous colon polyps, chronic constipation, GERD, and history of small bowel obstruction status post LOA.  Kurt Hall is known to Dr. Hilarie Fredrickson, last office visit May 2018 for evaluation of constipation.  He had not seen any benefit from MiraLAX nor suppositories so we started Linzess 145 mgs daily  Linzess didn't work well, he restarted MiraLAX and fiber which worked fine until 3 to 4 months ago.  Since then he has been having small volume bowel movements 2-3 times a day but some days he doesn't have a BM.  He has gone as much as a week in between BMs. Recently had to purge bowels with Mg+ citrate and Biscodyl . The constipation has flared hemorrhoids. He has had some scant bleeding with BMSs. Using Prep H sporadically.    In addition to above Kurt Hall has been having mid abdominal pain for about 6 months.  Crampy pain is different than when he had an EGD several years ago.  This pain is crampy,  non-radiating. Episodes last up to an hour and occur mainly at night.  He has found that pain less likely to occurs if eats a light meal / soup for dinner. Passing gas helps with the pain.  He has not had any associated nausea or vomiting but does report a poor appetite associated with a 10 pound weight loss.   Review of systems:     No chest pain, no SOB, no fevers, no urinary sx   Past Medical History:  Diagnosis Date  . Allergic rhinitis   . Allergy   . Cataract   . Colon polyps   . Dactylitis   . GERD (gastroesophageal reflux disease)   . Hemorrhoids   . Hx of basal cell carcinoma   . Hx of herpes genitalis   . Hypercholesterolemia   . Hypertension   . Prostate cancer [facility])    followed by [facility]  . Tubular adenoma of colon     Patient's surgical history, family medical history, social history, medications and allergies were all reviewed in Epic     Current Outpatient Medications  Medication Sig Dispense Refill  . amLODipine (NORVASC) 10 MG tablet Take 10 mg by mouth daily.    Marland Kitchen aspirin 81 MG tablet Take 81 mg by mouth daily.    Marland Kitchen atorvastatin (LIPITOR) 20 MG tablet Take 20 mg by mouth daily.    . clonazePAM (KLONOPIN) 0.5 MG tablet Take 0.5 mg  by mouth 2 (two) times daily as needed.    . folic acid (FOLVITE) 269 MCG tablet Take 400 mcg by mouth daily.    Marland Kitchen losartan-hydrochlorothiazide (HYZAAR) 100-25 MG tablet Take 1 tablet by mouth daily.    . Multiple Vitamins-Minerals (MULTIVITAMIN PO) Take by mouth daily.     No current facility-administered medications for this visit.     Physical Exam:     BP 118/62   Pulse 75   Ht _0  (1.753 m)   Wt 196 lb 12.8 oz (89.3 kg)   SpO2 98%   BMI 29.06 kg/m   GENERAL:  Pleasant emale in NAD PSYCH: : Cooperative, normal affect EENT:  conjunctiva pink, mucous membranes moist, neck supple without masses CARDIAC:  RRR,  no peripheral edema PULM: Normal respiratory effort, lungs CTA bilaterally, no wheezing ABDOMEN:   Nondistended, soft, nontender. No obvious masses, no hepatomegaly,  normal bowel sounds SKIN:  turgor, no lesions seen Musculoskeletal:  Normal muscle tone, normal strength NEURO: Alert and oriented x 3, no focal neurologic deficits   Kurt Hall , NP 11/29/2018, 11:32 AM

## 2018-11-29 NOTE — Patient Instructions (Addendum)
If you are age 73 or older, your body mass index should be between 23-30. Your Body mass index is 29.06 kg/m. If this is out of the aforementioned range listed, please consider follow up with your Primary Care Provider.  If you are age 55 or younger, your body mass index should be between 19-25. Your Body mass index is 29.06 kg/m. If this is out of the aformentioned range listed, please consider follow up with your Primary Care Provider.   You have been scheduled for an endoscopy and colonoscopy. Please follow the written instructions given to you at your visit today. Please pick up your prep supplies at the pharmacy within the next 1-3 days. If you use inhalers (even only as needed), please bring them with you on the day of your procedure. Your physician has requested that you go to www.startemmi.com and enter the access code given to you at your visit today. This web site gives a general overview about your procedure. However, you should still follow specific instructions given to you by our office regarding your preparation for the procedure.  We have sent the following medications to your pharmacy for you to pick up at your convenience: [place]  Your provider has requested that you go to the basement level for lab work before leaving today. Press "B" on the elevator. The lab is located at the first door on the left as you exit the elevator.   Due to recent COVID-19 restrictions implemented by [employer] and state authorities and in an effort to keep both patients and staff as safe as possible, [facility] requires COVID-19 testing prior to any scheduled endoscopic procedure. The testing center is located at [address] [place], [place] [address] in the [facility] [employer]  suite.  Your appointment has been scheduled for 12/20/18 at 11:30 am.   Please bring your insurance cards to this appointment. You will require  your COVID screen 2 business days prior to your endoscopic procedure.  You are not required to quarantine after your screening.  You will only receive a phone call with the results if it is POSITIVE.  If you do not receive a call the day before your procedure you should begin your prep, if ordered, and you should report to the endo center for your procedure at your designated appointment arrival time ( one hour prior to the procedure time). There is no cost to you for the screening on the day of the swab.  [facility] will file with your insurance company for the testing.    You may receive an automated phone call prior to your procedure or have a message in your MyChart that you have an appointment for a BP/15 at the [facility], please disregard this message.  Your testing will be at the [address] [place] location.   If you are leaving [facility] travel [address] on [address], turn left onto [address], turn night onto [address] at the 1st stop light turn right, pass the [employer] on your right and proceed to [place] (white building).   Thank you for choosing me and [facility].   Tye Savoy, NP

## 2018-11-30 ENCOUNTER — Encounter: Payer: Self-pay | Admitting: Nurse Practitioner

## 2018-11-30 NOTE — Progress Notes (Signed)
Addendum: Reviewed and agree with assessment and management plan. Samreen Seltzer M, MD  

## 2018-12-01 ENCOUNTER — Telehealth: Payer: Self-pay | Admitting: Nurse Practitioner

## 2018-12-01 MED ORDER — NA SULFATE-K SULFATE-MG SULF 17.5-3.13-1.6 GM/177ML PO SOLN: ORAL | 0 refills | Status: DC

## 2018-12-01 MED ORDER — LUBIPROSTONE 24 MCG PO CAPS: 24.0000 ug | ORAL_CAPSULE | Freq: Two times a day (BID) | ORAL | 2 refills | Status: DC

## 2018-12-01 MED ORDER — OMEPRAZOLE 20 MG PO CPDR: 20.0000 mg | DELAYED_RELEASE_CAPSULE | Freq: Every day | ORAL | 2 refills | Status: DC

## 2018-12-01 NOTE — Telephone Encounter (Signed)
Pt requested for his three prescriptions sent 11/29/18 to be sent to [facility] in [place].

## 2018-12-01 NOTE — Telephone Encounter (Signed)
Spoke with patient about correct pharmacy.  He states he told CMA that he is using [facility] in [place]. I told him I will change in his chart.

## 2018-12-01 NOTE — Addendum Note (Signed)
Addended by: Candie Mile on: 12/01/2018 11:20 AM   Modules accepted: Orders

## 2018-12-02 ENCOUNTER — Other Ambulatory Visit: Payer: Self-pay

## 2018-12-02 DIAGNOSIS — R748 Abnormal levels of other serum enzymes: Secondary | ICD-10-CM

## 2018-12-07 DIAGNOSIS — C61 Malignant neoplasm of prostate: Secondary | ICD-10-CM | POA: Diagnosis not present

## 2018-12-07 DIAGNOSIS — Z961 Presence of intraocular lens: Secondary | ICD-10-CM | POA: Diagnosis not present

## 2018-12-07 DIAGNOSIS — Z9889 Other specified postprocedural states: Secondary | ICD-10-CM | POA: Diagnosis not present

## 2018-12-07 DIAGNOSIS — R7989 Other specified abnormal findings of blood chemistry: Secondary | ICD-10-CM | POA: Diagnosis not present

## 2018-12-07 DIAGNOSIS — H35372 Puckering of macula, left eye: Secondary | ICD-10-CM | POA: Diagnosis not present

## 2018-12-07 DIAGNOSIS — H2511 Age-related nuclear cataract, right eye: Secondary | ICD-10-CM | POA: Diagnosis not present

## 2018-12-14 ENCOUNTER — Other Ambulatory Visit (INDEPENDENT_AMBULATORY_CARE_PROVIDER_SITE_OTHER): Payer: Medicare Other

## 2018-12-14 DIAGNOSIS — R748 Abnormal levels of other serum enzymes: Secondary | ICD-10-CM | POA: Diagnosis not present

## 2018-12-14 LAB — LIPASE: Lipase: 78 U/L — ABNORMAL HIGH (ref 11.0–59.0)

## 2018-12-16 ENCOUNTER — Telehealth: Payer: Self-pay | Admitting: Internal Medicine

## 2018-12-16 DIAGNOSIS — N529 Male erectile dysfunction, unspecified: Secondary | ICD-10-CM | POA: Diagnosis not present

## 2018-12-16 DIAGNOSIS — K5900 Constipation, unspecified: Secondary | ICD-10-CM | POA: Diagnosis not present

## 2018-12-16 DIAGNOSIS — N5231 Erectile dysfunction following radical prostatectomy: Secondary | ICD-10-CM | POA: Diagnosis not present

## 2018-12-16 DIAGNOSIS — Z923 Personal history of irradiation: Secondary | ICD-10-CM | POA: Diagnosis not present

## 2018-12-16 DIAGNOSIS — Z9079 Acquired absence of other genital organ(s): Secondary | ICD-10-CM | POA: Diagnosis not present

## 2018-12-16 DIAGNOSIS — R7989 Other specified abnormal findings of blood chemistry: Secondary | ICD-10-CM | POA: Diagnosis not present

## 2018-12-16 DIAGNOSIS — C61 Malignant neoplasm of prostate: Secondary | ICD-10-CM | POA: Diagnosis not present

## 2018-12-16 NOTE — Telephone Encounter (Signed)
Patient is calling asking if he is still okay for his colonoscopy. He said he had to get blood work done last week again because they were concerned with his kidney function and did not know if he was okay to still have the colonoscopy.

## 2018-12-16 NOTE — Telephone Encounter (Signed)
Called back to the patient. No answer.. No voicemail.  

## 2018-12-19 DIAGNOSIS — Z8546 Personal history of malignant neoplasm of prostate: Secondary | ICD-10-CM | POA: Diagnosis not present

## 2018-12-19 DIAGNOSIS — K7689 Other specified diseases of liver: Secondary | ICD-10-CM | POA: Diagnosis not present

## 2018-12-19 DIAGNOSIS — R7989 Other specified abnormal findings of blood chemistry: Secondary | ICD-10-CM | POA: Diagnosis not present

## 2018-12-19 DIAGNOSIS — N200 Calculus of kidney: Secondary | ICD-10-CM | POA: Diagnosis not present

## 2018-12-20 ENCOUNTER — Ambulatory Visit (INDEPENDENT_AMBULATORY_CARE_PROVIDER_SITE_OTHER): Payer: Self-pay

## 2018-12-20 ENCOUNTER — Telehealth: Payer: Self-pay | Admitting: Internal Medicine

## 2018-12-20 ENCOUNTER — Encounter: Payer: Self-pay | Admitting: Internal Medicine

## 2018-12-20 ENCOUNTER — Other Ambulatory Visit: Payer: Self-pay | Admitting: Internal Medicine

## 2018-12-20 DIAGNOSIS — Z1159 Encounter for screening for other viral diseases: Secondary | ICD-10-CM

## 2018-12-20 NOTE — Telephone Encounter (Signed)
Prescription WAS sent to [facility] in [place], [place] on 12/01/18 when he requested. I contacted pharmacy to confirm. They state they do have prescription but insurance needs prior authorization. They did not send [place] the request previously. I have now requested prior authorization through [email or web address]  Dx: K59.04 Patient has previously tried and failed: Linzess, fiber and Miralax Patient is "geriatric" which is a contraindication to taking long term lactulose per FDA guidlelines which should make it allowable for him to receive Amitiza through his insurance.

## 2018-12-21 LAB — SARS CORONAVIRUS 2 (TAT 6-24 HRS): SARS Coronavirus 2: NEGATIVE

## 2018-12-22 ENCOUNTER — Other Ambulatory Visit: Payer: Self-pay

## 2018-12-22 ENCOUNTER — Ambulatory Visit (AMBULATORY_SURGERY_CENTER): Payer: Medicare Other | Admitting: Internal Medicine

## 2018-12-22 ENCOUNTER — Encounter: Payer: Self-pay | Admitting: Internal Medicine

## 2018-12-22 ENCOUNTER — Telehealth: Payer: Self-pay | Admitting: Nurse Practitioner

## 2018-12-22 VITALS — BP 128/77 | HR 62 | Temp 98.6°F | Resp 14 | Ht 69.0 in | Wt 196.0 lb

## 2018-12-22 DIAGNOSIS — Z86010 Personal history of colonic polyps: Secondary | ICD-10-CM | POA: Diagnosis not present

## 2018-12-22 DIAGNOSIS — D125 Benign neoplasm of sigmoid colon: Secondary | ICD-10-CM | POA: Diagnosis not present

## 2018-12-22 DIAGNOSIS — K635 Polyp of colon: Secondary | ICD-10-CM | POA: Diagnosis not present

## 2018-12-22 DIAGNOSIS — D122 Benign neoplasm of ascending colon: Secondary | ICD-10-CM

## 2018-12-22 DIAGNOSIS — K2970 Gastritis, unspecified, without bleeding: Secondary | ICD-10-CM | POA: Diagnosis not present

## 2018-12-22 DIAGNOSIS — K3189 Other diseases of stomach and duodenum: Secondary | ICD-10-CM

## 2018-12-22 DIAGNOSIS — K2950 Unspecified chronic gastritis without bleeding: Secondary | ICD-10-CM | POA: Diagnosis not present

## 2018-12-22 MED ORDER — SODIUM CHLORIDE 0.9 % IV SOLN: 500.0000 mL | Freq: Once | INTRAVENOUS | Status: DC

## 2018-12-22 NOTE — Op Note (Signed)
[facility] Patient Name: Kurt Hall Procedure Date: 12/22/2018 2:20 PM MRN: [id number] Endoscopist: Jerene Bears , MD Age: 74 Referring MD:  Date of Birth: 13-Apr-1945 Gender: Male Account #: [id number] Procedure:                Upper GI endoscopy Indications:              Epigastric abdominal pain Medicines:                Monitored Anesthesia Care Procedure:                Pre-Anesthesia Assessment:                           - Prior to the procedure, a History and Physical                            was performed, and patient medications and                            allergies were reviewed. The patient's tolerance of                            previous anesthesia was also reviewed. The risks                            and benefits of the procedure and the sedation                            options and risks were discussed with the patient.                            All questions were answered, and informed consent                            was obtained. Prior Anticoagulants: The patient has                            taken no previous anticoagulant or antiplatelet                            agents. ASA Grade Assessment: III - A patient with                            severe systemic disease. After reviewing the risks                            and benefits, the patient was deemed in                            satisfactory condition to undergo the procedure.                           After obtaining informed consent, the endoscope was  passed under direct vision. Throughout the                            procedure, the patient's blood pressure, pulse, and                            oxygen saturations were monitored continuously. The                            Endoscope was introduced through the mouth, and                            advanced to the second part of duodenum. The upper                            GI endoscopy was accomplished  without difficulty.                            The patient tolerated the procedure well. Scope In: Scope Out: Findings:                 The examined esophagus was normal.                           The entire examined stomach was normal. Biopsies                            were taken with a cold forceps for histology and                            Helicobacter pylori testing.                           Patchy mildly erythematous mucosa was found in the                            duodenal bulb.                           The second portion of the duodenum was normal. Complications:            No immediate complications. Estimated Blood Loss:     Estimated blood loss was minimal. Impression:               - Normal esophagus.                           - Normal stomach. Biopsied.                           - Erythematous duodenopathy.                           - Normal second portion of the duodenum. Recommendation:           - Patient has a contact number available for  emergencies. The signs and symptoms of potential                            delayed complications were discussed with the                            patient. Return to normal activities tomorrow.                            Written discharge instructions were provided to the                            patient.                           - Resume previous diet.                           - Continue present medications.                           - Await pathology results. Jerene Bears, MD 12/22/2018 3:12:26 PM This report has been signed electronically.

## 2018-12-22 NOTE — Telephone Encounter (Signed)
Error

## 2018-12-22 NOTE — Op Note (Signed)
[facility] Patient Name: Kurt Hall Procedure Date: 12/22/2018 2:20 PM MRN: [id number] Endoscopist: Jerene Bears , MD Age: 73 Referring MD:  Date of Birth: [birth year] Gender: Male Account #: [id number] Procedure:                Colonoscopy Indications:              Surveillance: Personal history of adenomatous                            polyps on last colonoscopy 5 years ago Medicines:                Monitored Anesthesia Care Procedure:                Pre-Anesthesia Assessment:                           - Prior to the procedure, a History and Physical                            was performed, and patient medications and                            allergies were reviewed. The patient's tolerance of                            previous anesthesia was also reviewed. The risks                            and benefits of the procedure and the sedation                            options and risks were discussed with the patient.                            All questions were answered, and informed consent                            was obtained. Prior Anticoagulants: The patient has                            taken no previous anticoagulant or antiplatelet                            agents. ASA Grade Assessment: III - A patient with                            severe systemic disease. After reviewing the risks                            and benefits, the patient was deemed in                            satisfactory condition to undergo the procedure.  After obtaining informed consent, the colonoscope                            was passed under direct vision. Throughout the                            procedure, the patient's blood pressure, pulse, and                            oxygen saturations were monitored continuously. The                            Colonoscope was introduced through the anus and                            advanced to the cecum,  identified by appendiceal                            orifice and ileocecal valve. The colonoscopy was                            performed without difficulty. The patient tolerated                            the procedure well. The quality of the bowel                            preparation was good. The ileocecal valve,                            appendiceal orifice, and rectum were photographed. Scope In: 2:44:07 PM Scope Out: 2:57:54 PM Scope Withdrawal Time: 0 hours 9 minutes 38 seconds  Total Procedure Duration: 0 hours 13 minutes 47 seconds  Findings:                 The digital rectal exam was normal.                           Three sessile polyps were found in the ascending                            colon. The polyps were 2 to 4 mm in size. These                            polyps were removed with a cold snare. Resection                            and retrieval were complete.                           A 3 mm polyp was found in the sigmoid colon. The                            polyp was sessile.  The polyp was removed with a                            cold snare. Resection and retrieval were complete.                           Scattered areas of very mild radiation proctitis in                            the distal rectum.                           Internal hemorrhoids were found during                            retroflexion. The hemorrhoids were small. Complications:            No immediate complications. Estimated Blood Loss:     Estimated blood loss was minimal. Impression:               - Three 2 to 4 mm polyps in the ascending colon,                            removed with a cold snare. Resected and retrieved.                           - One 3 mm polyp in the sigmoid colon, removed with                            a cold snare. Resected and retrieved.                           - Mild and scattered radiation proctitis.                           - Small internal  hemorrhoids. Recommendation:           - Patient has a contact number available for                            emergencies. The signs and symptoms of potential                            delayed complications were discussed with the                            patient. Return to normal activities tomorrow.                            Written discharge instructions were provided to the                            patient.                           - Resume previous diet.                           -  Continue present medications.                           - Await pathology results.                           - Repeat colonoscopy is recommended. The                            colonoscopy date will be determined after pathology                            results from today's exam become available for                            review. Jerene Bears, MD 12/22/2018 3:17:09 PM This report has been signed electronically.

## 2018-12-22 NOTE — Patient Instructions (Addendum)
PLease see handouts given to you on Polyps and Hemorrhoids.  Thank you for letting us take care of your healthcare needs today.     YOU HAD AN ENDOSCOPIC PROCEDURE TODAY AT [facility]:   Refer to the procedure report that was given to you for any specific questions about what was found during the examination.  If the procedure report does not answer your questions, please call your gastroenterologist to clarify.  If you requested that your care partner not be given the details of your procedure findings, then the procedure report has been included in a sealed envelope for you to review at your convenience later.  YOU SHOULD EXPECT: Some feelings of bloating in the abdomen. Passage of more gas than usual.  Walking can help get rid of the air that was put into your GI tract during the procedure and reduce the bloating. If you had a lower endoscopy (such as a colonoscopy or flexible sigmoidoscopy) you may notice spotting of blood in your stool or on the toilet paper. If you underwent a bowel prep for your procedure, you may not have a normal bowel movement for a few days.  Please Note:  You might notice some irritation and congestion in your nose or some drainage.  This is from the oxygen used during your procedure.  There is no need for concern and it should clear up in a day or so.  SYMPTOMS TO REPORT IMMEDIATELY:   Following lower endoscopy (colonoscopy or flexible sigmoidoscopy):  Excessive amounts of blood in the stool  Significant tenderness or worsening of abdominal pains  Swelling of the abdomen that is new, acute  Fever of 100F or higher   Following upper endoscopy (EGD)  Vomiting of blood or coffee ground material  New chest pain or pain under the shoulder blades  Painful or persistently difficult swallowing  New shortness of breath  Fever of 100F or higher  Black, tarry-looking stools  For urgent or emergent issues, a gastroenterologist can be reached at any  hour by calling [phone].   DIET:  We do recommend a small meal at first, but then you may proceed to your regular diet.  Drink plenty of fluids but you should avoid alcoholic beverages for 24 hours.  ACTIVITY:  You should plan to take it easy for the rest of today and you should NOT DRIVE or use heavy machinery until tomorrow (because of the sedation medicines used during the test).    FOLLOW UP: Our staff will call the number listed on your records 48-72 hours following your procedure to check on you and address any questions or concerns that you may have regarding the information given to you following your procedure. If we do not reach you, we will leave a message.  We will attempt to reach you two times.  During this call, we will ask if you have developed any symptoms of COVID 19. If you develop any symptoms (ie: fever, flu-like symptoms, shortness of breath, cough etc.) before then, please call [phone].  If you test positive for Covid 19 in the 2 weeks post procedure, please call and report this information to [place].    If any biopsies were taken you will be contacted by phone or by letter within the next 1-3 weeks.  Please call us at [phone] if you have not heard about the biopsies in 3 weeks.    SIGNATURES/CONFIDENTIALITY: You and/or your care partner have signed paperwork which will be entered  into your electronic medical record.  These signatures attest to the fact that that the information above on your After Visit Summary has been reviewed and is understood.  Full responsibility of the confidentiality of this discharge information lies with you and/or your care-partner.

## 2018-12-22 NOTE — Telephone Encounter (Signed)
Labs in [place] show normal BUN and Creatinine.  Patient advised.

## 2018-12-22 NOTE — Progress Notes (Signed)
To PACU, VSS. Report to Rn.tb 

## 2018-12-23 NOTE — Telephone Encounter (Signed)
[facility] - Rx #[id number] Need help? Call us at [phone] Outcome Approved on November 11 Effective from 12/20/2018 through 12/20/2019

## 2018-12-26 ENCOUNTER — Telehealth: Payer: Self-pay

## 2018-12-26 NOTE — Telephone Encounter (Signed)
  Follow up Call-  Call back number 12/22/2018  Post procedure Call Back phone  # [phone]  Permission to leave phone message Yes  Some recent data might be hidden     Patient questions:  Do you have a fever, pain , or abdominal swelling? No. Pain Score  0 *  Have you tolerated food without any problems? Yes.    Have you been able to return to your normal activities? Yes.    Do you have any questions about your discharge instructions: Diet   No. Medications  No. Follow up visit  No.  Do you have questions or concerns about your Care? No.  Actions: * If pain score is 4 or above: No action needed, pain <4.   1. Have you developed a fever since your procedure? no  2.   Have you had an respiratory symptoms (SOB or cough) since your procedure? no  3.   Have you tested positive for COVID 19 since your procedure no  4.   Have you had any family members/close contacts diagnosed with the COVID 19 since your procedure?  no   If yes to any of these questions please route to Joylene John, RN and Alphonsa Gin, Therapist, sports.

## 2018-12-27 ENCOUNTER — Encounter: Payer: Self-pay | Admitting: Internal Medicine

## 2018-12-28 DIAGNOSIS — I10 Essential (primary) hypertension: Secondary | ICD-10-CM | POA: Diagnosis not present

## 2018-12-28 DIAGNOSIS — K7689 Other specified diseases of liver: Secondary | ICD-10-CM | POA: Diagnosis not present

## 2018-12-28 DIAGNOSIS — N1831 Chronic kidney disease, stage 3a: Secondary | ICD-10-CM | POA: Diagnosis not present

## 2019-01-11 ENCOUNTER — Other Ambulatory Visit: Payer: Self-pay

## 2019-01-12 DIAGNOSIS — N184 Chronic kidney disease, stage 4 (severe): Secondary | ICD-10-CM | POA: Diagnosis not present

## 2019-01-12 DIAGNOSIS — E782 Mixed hyperlipidemia: Secondary | ICD-10-CM | POA: Diagnosis not present

## 2019-01-12 DIAGNOSIS — C61 Malignant neoplasm of prostate: Secondary | ICD-10-CM | POA: Diagnosis not present

## 2019-01-12 DIAGNOSIS — I10 Essential (primary) hypertension: Secondary | ICD-10-CM | POA: Diagnosis not present

## 2019-02-15 DIAGNOSIS — K7689 Other specified diseases of liver: Secondary | ICD-10-CM | POA: Diagnosis not present

## 2019-02-28 ENCOUNTER — Other Ambulatory Visit: Payer: Self-pay | Admitting: Nurse Practitioner

## 2019-03-08 DIAGNOSIS — I10 Essential (primary) hypertension: Secondary | ICD-10-CM | POA: Diagnosis not present

## 2019-03-08 DIAGNOSIS — N1831 Chronic kidney disease, stage 3a: Secondary | ICD-10-CM | POA: Diagnosis not present

## 2019-03-18 DIAGNOSIS — K7689 Other specified diseases of liver: Secondary | ICD-10-CM | POA: Diagnosis not present

## 2019-03-24 ENCOUNTER — Telehealth: Payer: Self-pay | Admitting: Nurse Practitioner

## 2019-03-24 NOTE — Telephone Encounter (Signed)
Pt called stating that he was returning your call. Pls call him again.

## 2019-03-24 NOTE — Telephone Encounter (Signed)
Spoke with the patient

## 2019-03-27 ENCOUNTER — Telehealth: Payer: Self-pay | Admitting: Internal Medicine

## 2019-03-27 NOTE — Telephone Encounter (Signed)
Patient called and stated he had a CT scan scheduled 04/11/19 and for the following day he is schedule for an MRI wanted to know if that would be an issue due to dye solution. I mentioned the follow up appt he has is with Dr. Hilarie Fredrickson.

## 2019-03-27 NOTE — Telephone Encounter (Signed)
Spoke with pt and let him know that he is just having an office visit with Dr. Hilarie Fredrickson on that date, no ct scan.

## 2019-04-03 DIAGNOSIS — B351 Tinea unguium: Secondary | ICD-10-CM | POA: Diagnosis not present

## 2019-04-03 DIAGNOSIS — L820 Inflamed seborrheic keratosis: Secondary | ICD-10-CM | POA: Diagnosis not present

## 2019-04-03 DIAGNOSIS — L570 Actinic keratosis: Secondary | ICD-10-CM | POA: Diagnosis not present

## 2019-04-03 DIAGNOSIS — L578 Other skin changes due to chronic exposure to nonionizing radiation: Secondary | ICD-10-CM | POA: Diagnosis not present

## 2019-04-04 DIAGNOSIS — G4700 Insomnia, unspecified: Secondary | ICD-10-CM | POA: Diagnosis not present

## 2019-04-04 DIAGNOSIS — M7551 Bursitis of right shoulder: Secondary | ICD-10-CM | POA: Diagnosis not present

## 2019-04-04 DIAGNOSIS — E782 Mixed hyperlipidemia: Secondary | ICD-10-CM | POA: Diagnosis not present

## 2019-04-04 DIAGNOSIS — I10 Essential (primary) hypertension: Secondary | ICD-10-CM | POA: Diagnosis not present

## 2019-04-10 ENCOUNTER — Encounter: Payer: Self-pay | Admitting: *Deleted

## 2019-04-11 ENCOUNTER — Ambulatory Visit: Payer: Medicare Other | Admitting: Internal Medicine

## 2019-04-11 ENCOUNTER — Encounter: Payer: Self-pay | Admitting: Internal Medicine

## 2019-04-11 ENCOUNTER — Other Ambulatory Visit: Payer: Self-pay

## 2019-04-11 VITALS — BP 136/68 | HR 70 | Temp 98.2°F | Ht 68.0 in | Wt 191.0 lb

## 2019-04-11 DIAGNOSIS — Z860100 Personal history of colon polyps, unspecified: Secondary | ICD-10-CM

## 2019-04-11 DIAGNOSIS — K7689 Other specified diseases of liver: Secondary | ICD-10-CM

## 2019-04-11 DIAGNOSIS — R748 Abnormal levels of other serum enzymes: Secondary | ICD-10-CM | POA: Diagnosis not present

## 2019-04-11 DIAGNOSIS — K5909 Other constipation: Secondary | ICD-10-CM

## 2019-04-11 MED ORDER — LUBIPROSTONE 24 MCG PO CAPS: 24.0000 ug | ORAL_CAPSULE | Freq: Two times a day (BID) | ORAL | 3 refills | Status: DC

## 2019-04-11 NOTE — Progress Notes (Addendum)
Subjective:    Patient ID: Kurt Hall, male    DOB: [birth year], 74 y.o.   MRN: [id number]  HPI Kurt Hall is a 74 year old male with a history of chronic constipation, adenomatous and sessile serrated colon polyps, GERD, history of bowel obstruction status post prior lysis of adhesions, history of prostate cancer who is seen for follow-up.  He was seen recently for upper endoscopy and colonoscopy in November 2020.  He is here alone today.  Since his office visit he was seen at [facility] hepatology for small liver cyst.  An MRI has been ordered and scheduled for tomorrow.  He had upper endoscopy and colonoscopy on 12/22/2018. EGD performed for epigastric pain was normal with exception of mild erythematous mucosa in the duodenal bulb.  Gastric biopsy showed mild reactive gastropathy without H. Pylori. 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  These polyps were tubular adenoma x1, SSP x2 and hyperplastic x1.  He reports that he continues to have constipation.  He tried Amitiza 24 mcg but only once a day.  It was ineffective so he stopped it.  He uses MiraLAX but that caused bad bloating and was not effective and also gas and so he stopped this as well.  He has been using Dulcolax every few days at bedtime to induce bowel movement.  His constipation symptoms have been present for longer than a year.  He denies upper GI and hepatobiliary complaint.  No further epigastric pain.  He was noted to have an elevated lipase having been checked twice.  Again his epigastric pain is improved.  No nausea or vomiting.  Good appetite.  Stable weight.  Upper endoscopy as above   Review of Systems As per HPI, otherwise negative  Current Medications, Allergies, Past Medical History, Past Surgical History, Family History and Social History were reviewed in [employer]  record.      Objective:   Physical Exam BP 136/68   Pulse 70   Temp 98.2 F (36.8 C)   Ht 5\' 8"  (1.727 m)   Wt 191 lb (86.6 kg)   BMI 29.04 kg/m  Gen: awake, alert, NAD HEENT: anicteric Ext: no c/c/e Neuro: nonfocal  Lipase     Component Value Date/Time   LIPASE 78.0 (H) 12/14/2018 1029   CMP     Component Value Date/Time   NA 135 11/29/2018 1224   K 4.0 11/29/2018 1224   CL 100 11/29/2018 1224   CO2 25 11/29/2018 1224   GLUCOSE 106 (H) 11/29/2018 1224   BUN 36 (H) 11/29/2018 1224   CREATININE 2.41 (H) 11/29/2018 1224   CALCIUM 9.3 11/29/2018 1224   PROT 7.5 11/29/2018 1224   ALBUMIN 4.7 11/29/2018 1224   AST 19 11/29/2018 1224   ALT 21 11/29/2018 1224   ALKPHOS 59 11/29/2018 1224   BILITOT 1.0 11/29/2018 1224   Renal ultrasound taken from care everywhere US RENAL COMPLETE  Indication: R79.89 Other specified abnormal findings of blood chemistry. History of prostate cancer.  Technique: Gray-scale and color Doppler ultrasound evaluation of the kidneys, region of the ureters, and urinary bladder.  Comparison: None  Findings: The right kidney measures 9.3 cm. There is no hydronephrosis. Renal cortical echogenicity is normal. Renal cortical thickness is normal. The right ureter is not visualized.  The left kidney measures 9.9 cm. There is no hydronephrosis. There are scattered calcifications throughout the cortex  of the left kidney, nonspecific but possibly representing sequelae of prior inflammation/infection. Left renal cystic structure at the inferior pole measures 0.9 x 1.0 0.6 cm. An additional cyst within the interpolar region of the left kidney measures 0.9 x 0.7 x 1.0 cm Renal cortical thickness is normal. The left ureter is not visualized.  The urinary bladder is partially decompressed.  There is an incidentally noted complex cystic structure within the right hepatic lobe measuring 1.2 x 1.7 x 1.3 cm  Impression:  1. No  hydronephrosis.  2. Incidentally noted complex cystic lesion within the right hepatic lobe measuring up to 1.3 cm. Consider follow up ultrasound in 3-6 months to ensure stability.   3. There are scattered calcifications throughout the cortex of the left kidney, nonspecific but possibly representing sequelae of prior inflammation/infection. Attention on followup.   Electronically Reviewed by: Aundria Mems, MD, [facility] Electronically Reviewed on: 12/19/2018 11:23 AM  I have reviewed the images and concur with the above findings.  Electronically Signed by: Louie Bun, MD, [facility] Electronically Signed on: 12/19/2018 1:53 PM     Assessment & Plan:  74 year old male with a history of chronic constipation, adenomatous and sessile serrated colon polyps, GERD, history of bowel obstruction status post prior lysis of adhesions, history of prostate cancer who is seen for follow-up.   1.  Chronic constipation --MiraLAX was ineffective and caused flatulence and excessive gas.  Amitiza was ineffective at 24 mcg but we did discuss that he was taking this only once a day and it is indicated for twice daily. --He will try Amitiza 24 mcg twice daily with food; try this for 2 weeks and notify me if ineffective --He will remain off MiraLAX and Dulcolax for now  2.  Elevated lipase --no evidence of abdominal pain recently exam is benign.  He is having an MRI with contrast to evaluate liver cyst tomorrow.  This will suffice for pancreatic imaging, and we will follow-up this result.  3.  Complex liver cyst, 1.3 cm --seen by [facility] hepatology recently for this issue.  No evidence for advanced liver disease.  MRI pending ordered for tomorrow at [facility]  4.  History of colon polyps --surveillance colonoscopy November 2025  30 minutes total spent today including patient facing time, coordination of care, reviewing medical history/procedures/pertinent radiology studies, and documentation of the  encounter.   Addendum:  MRI of the abdomen reviewed performed at [facility] on 04/12/2019; see results below. --There are 2 simple cysts within the liver but no suspicious lesions or evidence for advanced liver disease.  These are benign. 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  We should reevaluate these by MRI in 1 year.  Please place recall for this MRI. --Lipase is slightly elevated but we do not need to work this up further at present --I can see him back in 6 to 12 months, sooner if needed for his chronic constipation. --Please share these results with the patient. JMP   Impression:  1. No suspicious hepatic lesion. The previously described lesion on prior ultrasound corresponds to two adjacent simple hepatic cysts versus a single cyst with a thin internal septation.   2. There is a 1.4 cm enhancing focus arising from the superior pole of the right kidney, suspicious for renal neoplasm although given extensive cortical scarring this could also represent a pseudolesion. Recommend followup MR  imaging in 6 months. This was placed in the unexpected findings folder.  3. 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   Electronically Reviewed by: Aundria Mems, MD, [facility] Electronically Reviewed on: 04/13/2019 9:40 AM  I have reviewed the images and concur with the above findings.  Electronically Signed by: Larrie Kass, MD, [facility] Electronically Signed on: 04/13/2019 2:20 PM

## 2019-04-11 NOTE — Patient Instructions (Signed)
We have sent the following medications to your pharmacy for you to pick up at your convenience: Amitiza 24 mcg to take one tablet by mouth twice daily with meals.   Stop taking Miralax.   If your symptoms are not improved after 2 weeks then call our office.   Normal BMI (Body Mass Index- based on height and weight) is between 23 and 30. Your BMI today is Body mass index is 29.04 kg/m. Marland Kitchen Please consider follow up  regarding your BMI with your Primary Care Provider.

## 2019-04-12 DIAGNOSIS — K7689 Other specified diseases of liver: Secondary | ICD-10-CM | POA: Diagnosis not present

## 2019-04-12 DIAGNOSIS — K769 Liver disease, unspecified: Secondary | ICD-10-CM | POA: Diagnosis not present

## 2019-04-17 ENCOUNTER — Telehealth: Payer: Self-pay | Admitting: Internal Medicine

## 2019-04-17 DIAGNOSIS — K7689 Other specified diseases of liver: Secondary | ICD-10-CM | POA: Diagnosis not present

## 2019-04-17 NOTE — Telephone Encounter (Signed)
I have contact [facility] and have given chronic constipation as dx code. Advised that patient has previously tried and failed Linzess both 72 mcg and 145 mcg as well as miralax and dulcolax. Insurance will contact us at a later time with determination.

## 2019-04-17 NOTE — Progress Notes (Signed)
Late entry:  On Friday, 04/14/19, I spoke to patient to advise that Dr Hilarie Fredrickson reviewed his MRI abdomen which was completed at [facility] on 04/12/19. As fas as liver is concerned, there were 2 simple, benign liver cysts, but no suspicious lesions or evidence for advanced liver disease. 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 Advised that a re-evaluation with MRI may be needed in 1 year for follow up. Explained that lipase was a bit elevated but no need for work up right now. Follow up in office would be 6-12 months for chronic constipation. Patient verbalizes understanding of this information. [facility] can advise regarding additional finds outside of liver area.

## 2019-04-17 NOTE — Telephone Encounter (Signed)
Cathy from [facility] called regarding a PA for Amitiza.  She requested diagnostic codes.

## 2019-04-19 ENCOUNTER — Telehealth: Payer: Self-pay | Admitting: Internal Medicine

## 2019-04-20 NOTE — Telephone Encounter (Signed)
Left message for pt to call back.  Pt states he took the amitiza 2 times in the morning and had diarrhea. He took it in the evening and it did not cause diarrhea but he only had a small BM. Pt states he will keep trying it to see how it does.

## 2019-05-02 DIAGNOSIS — E782 Mixed hyperlipidemia: Secondary | ICD-10-CM | POA: Diagnosis not present

## 2019-05-02 DIAGNOSIS — G4700 Insomnia, unspecified: Secondary | ICD-10-CM | POA: Diagnosis not present

## 2019-05-02 DIAGNOSIS — I10 Essential (primary) hypertension: Secondary | ICD-10-CM | POA: Diagnosis not present

## 2019-05-02 DIAGNOSIS — N184 Chronic kidney disease, stage 4 (severe): Secondary | ICD-10-CM | POA: Diagnosis not present

## 2019-06-13 DIAGNOSIS — N289 Disorder of kidney and ureter, unspecified: Secondary | ICD-10-CM | POA: Diagnosis not present

## 2019-06-27 DIAGNOSIS — J0100 Acute maxillary sinusitis, unspecified: Secondary | ICD-10-CM | POA: Diagnosis not present

## 2019-07-06 DIAGNOSIS — N1831 Chronic kidney disease, stage 3a: Secondary | ICD-10-CM | POA: Diagnosis not present

## 2019-07-06 DIAGNOSIS — I10 Essential (primary) hypertension: Secondary | ICD-10-CM | POA: Diagnosis not present

## 2019-08-08 DIAGNOSIS — K862 Cyst of pancreas: Secondary | ICD-10-CM | POA: Diagnosis not present

## 2019-08-08 DIAGNOSIS — N2889 Other specified disorders of kidney and ureter: Secondary | ICD-10-CM | POA: Diagnosis not present

## 2019-09-29 ENCOUNTER — Other Ambulatory Visit: Payer: Self-pay | Admitting: Nurse Practitioner

## 2019-10-21 ENCOUNTER — Telehealth: Payer: Self-pay | Admitting: Oncology

## 2019-11-07 DIAGNOSIS — I10 Essential (primary) hypertension: Secondary | ICD-10-CM | POA: Diagnosis not present

## 2019-11-07 DIAGNOSIS — E782 Mixed hyperlipidemia: Secondary | ICD-10-CM | POA: Diagnosis not present

## 2019-11-07 DIAGNOSIS — Z0000 Encounter for general adult medical examination without abnormal findings: Secondary | ICD-10-CM | POA: Diagnosis not present

## 2019-11-07 DIAGNOSIS — G4700 Insomnia, unspecified: Secondary | ICD-10-CM | POA: Diagnosis not present

## 2019-11-07 DIAGNOSIS — C61 Malignant neoplasm of prostate: Secondary | ICD-10-CM | POA: Diagnosis not present

## 2019-11-08 DIAGNOSIS — I10 Essential (primary) hypertension: Secondary | ICD-10-CM | POA: Diagnosis not present

## 2019-11-08 DIAGNOSIS — N1831 Chronic kidney disease, stage 3a: Secondary | ICD-10-CM | POA: Diagnosis not present

## 2020-01-01 DIAGNOSIS — J302 Other seasonal allergic rhinitis: Secondary | ICD-10-CM | POA: Diagnosis not present

## 2020-01-11 DIAGNOSIS — E782 Mixed hyperlipidemia: Secondary | ICD-10-CM | POA: Diagnosis not present

## 2020-01-11 DIAGNOSIS — K219 Gastro-esophageal reflux disease without esophagitis: Secondary | ICD-10-CM | POA: Diagnosis not present

## 2020-01-11 DIAGNOSIS — G4700 Insomnia, unspecified: Secondary | ICD-10-CM | POA: Diagnosis not present

## 2020-01-11 DIAGNOSIS — I10 Essential (primary) hypertension: Secondary | ICD-10-CM | POA: Diagnosis not present

## 2020-01-12 DIAGNOSIS — N1832 Chronic kidney disease, stage 3b: Secondary | ICD-10-CM | POA: Diagnosis not present

## 2020-01-23 DIAGNOSIS — M722 Plantar fascial fibromatosis: Secondary | ICD-10-CM | POA: Diagnosis not present

## 2020-01-23 DIAGNOSIS — M79606 Pain in leg, unspecified: Secondary | ICD-10-CM | POA: Diagnosis not present

## 2020-01-23 DIAGNOSIS — R252 Cramp and spasm: Secondary | ICD-10-CM | POA: Diagnosis not present

## 2020-04-02 DIAGNOSIS — C44329 Squamous cell carcinoma of skin of other parts of face: Secondary | ICD-10-CM | POA: Diagnosis not present

## 2020-04-02 DIAGNOSIS — L578 Other skin changes due to chronic exposure to nonionizing radiation: Secondary | ICD-10-CM | POA: Diagnosis not present

## 2020-04-02 DIAGNOSIS — L821 Other seborrheic keratosis: Secondary | ICD-10-CM | POA: Diagnosis not present

## 2020-04-02 DIAGNOSIS — L570 Actinic keratosis: Secondary | ICD-10-CM | POA: Diagnosis not present

## 2020-04-11 DIAGNOSIS — C61 Malignant neoplasm of prostate: Secondary | ICD-10-CM | POA: Diagnosis not present

## 2020-04-11 DIAGNOSIS — N289 Disorder of kidney and ureter, unspecified: Secondary | ICD-10-CM | POA: Diagnosis not present

## 2020-04-11 DIAGNOSIS — N5231 Erectile dysfunction following radical prostatectomy: Secondary | ICD-10-CM | POA: Diagnosis not present

## 2020-05-08 DIAGNOSIS — I10 Essential (primary) hypertension: Secondary | ICD-10-CM | POA: Diagnosis not present

## 2020-05-08 DIAGNOSIS — N1831 Chronic kidney disease, stage 3a: Secondary | ICD-10-CM | POA: Diagnosis not present

## 2020-05-09 DIAGNOSIS — K5900 Constipation, unspecified: Secondary | ICD-10-CM | POA: Diagnosis not present

## 2020-05-09 DIAGNOSIS — I10 Essential (primary) hypertension: Secondary | ICD-10-CM | POA: Diagnosis not present

## 2020-05-09 DIAGNOSIS — E782 Mixed hyperlipidemia: Secondary | ICD-10-CM | POA: Diagnosis not present

## 2020-05-09 DIAGNOSIS — G4700 Insomnia, unspecified: Secondary | ICD-10-CM | POA: Diagnosis not present

## 2020-05-14 DIAGNOSIS — J0100 Acute maxillary sinusitis, unspecified: Secondary | ICD-10-CM | POA: Diagnosis not present

## 2020-05-29 ENCOUNTER — Other Ambulatory Visit: Payer: Self-pay | Admitting: Nurse Practitioner

## 2020-07-12 ENCOUNTER — Telehealth: Payer: Self-pay | Admitting: *Deleted

## 2020-07-12 DIAGNOSIS — K862 Cyst of pancreas: Secondary | ICD-10-CM

## 2020-07-12 DIAGNOSIS — K7689 Other specified diseases of liver: Secondary | ICD-10-CM

## 2020-07-12 NOTE — Telephone Encounter (Signed)
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 However, it looks like her had an MRI abdomen 07/2019 (care-everywhere). Does he still need follow up MRI now or should he wait until 07/2019? Please advise.  ----- Message ----- From: Larina Bras, CMA Sent: 03/18/2020  12:00 AM EST To: Larina Bras, CMA  Pt needs repeat MRI of abdomen around 04/2020. See Dr Hilarie Fredrickson office note 04/14/19

## 2020-07-12 NOTE — Telephone Encounter (Signed)
Patient has been scheduled for MR abd w/wo contrast for evaluation of pancreatic and liver cyst at [facility] radiology on 07/23/20 at 9 am. He should arrive at 8:30 am in admitting for registration. NPO 4 hours prior. In addition, patient will need bun/creatinine drawn prior to appointment.  I have contacted patient regarding MRI but he states they are following him with MRI at [facility]. I explained that they have been following his renal issues. However, Dr Garth Schlatter' recommendation for pancreatic and liver cyst follow up was a 1 year MRI follow up from his last MRI which was 07/2019. Patient says he will call [facility] and will be back in touch with our office as to whether he will go forward with MRI here.

## 2020-07-16 NOTE — Telephone Encounter (Signed)
I have left a message for patient to call back. I need to find out whether he plans on keeping the MRI scheduled for 07/23/20.

## 2020-07-17 NOTE — Telephone Encounter (Signed)
Fyi, pt returned your call. I gave him your msg. He stated that he is keeping MRI appt on 6/14. I gave him the instructions indicated on the appt notes.

## 2020-07-23 ENCOUNTER — Ambulatory Visit (HOSPITAL_COMMUNITY): Payer: Medicare Other

## 2020-07-24 NOTE — Telephone Encounter (Signed)
Dottie Can you investigate with patient, why did he cancel the MRI?  There was ? Of if it was to be done at [employer]

## 2020-07-24 NOTE — Telephone Encounter (Signed)
FYI- Patient cancelled the MR abdomen

## 2020-07-25 NOTE — Telephone Encounter (Signed)
It appears patient has reached out to his oncology team at [facility] to discuss whether he should go forward with MRI here. He stated previously that he was following with MRI at [facility] for his kidney disease and needed to continue there, not due until later this year. I advised that we follow him for pancreatic and liver cyst which were to be followed 1 year from his last MRI done 07/2019. He wanted [facility] blessings prior to going forward with MRI here.

## 2020-08-22 NOTE — Telephone Encounter (Signed)
Inbound call from patient requesting to reschedule MRI please.

## 2020-08-22 NOTE — Telephone Encounter (Signed)
I have left a message for patient giving him the phone number of radiology scheduling as the previous MRI order should still be in effect.

## 2020-09-27 NOTE — Telephone Encounter (Signed)
error 

## 2020-10-30 DIAGNOSIS — H02831 Dermatochalasis of right upper eyelid: Secondary | ICD-10-CM | POA: Diagnosis not present

## 2020-10-30 DIAGNOSIS — H02834 Dermatochalasis of left upper eyelid: Secondary | ICD-10-CM | POA: Diagnosis not present

## 2020-10-30 DIAGNOSIS — H04123 Dry eye syndrome of bilateral lacrimal glands: Secondary | ICD-10-CM | POA: Diagnosis not present

## 2020-10-30 DIAGNOSIS — H2511 Age-related nuclear cataract, right eye: Secondary | ICD-10-CM | POA: Diagnosis not present

## 2020-11-18 DIAGNOSIS — Z0000 Encounter for general adult medical examination without abnormal findings: Secondary | ICD-10-CM | POA: Diagnosis not present

## 2020-11-18 DIAGNOSIS — E782 Mixed hyperlipidemia: Secondary | ICD-10-CM | POA: Diagnosis not present

## 2020-11-18 DIAGNOSIS — I10 Essential (primary) hypertension: Secondary | ICD-10-CM | POA: Diagnosis not present

## 2020-11-18 DIAGNOSIS — G4700 Insomnia, unspecified: Secondary | ICD-10-CM | POA: Diagnosis not present

## 2020-12-11 DIAGNOSIS — I10 Essential (primary) hypertension: Secondary | ICD-10-CM | POA: Diagnosis not present

## 2020-12-11 DIAGNOSIS — N1831 Chronic kidney disease, stage 3a: Secondary | ICD-10-CM | POA: Diagnosis not present

## 2020-12-30 ENCOUNTER — Ambulatory Visit: Payer: Medicare Other | Admitting: Physician Assistant

## 2020-12-30 ENCOUNTER — Encounter: Payer: Self-pay | Admitting: Physician Assistant

## 2020-12-30 VITALS — BP 120/74 | HR 81 | Ht 68.0 in | Wt 202.4 lb

## 2020-12-30 DIAGNOSIS — R140 Abdominal distension (gaseous): Secondary | ICD-10-CM | POA: Diagnosis not present

## 2020-12-30 DIAGNOSIS — K7689 Other specified diseases of liver: Secondary | ICD-10-CM

## 2020-12-30 DIAGNOSIS — K862 Cyst of pancreas: Secondary | ICD-10-CM

## 2020-12-30 DIAGNOSIS — K5909 Other constipation: Secondary | ICD-10-CM | POA: Diagnosis not present

## 2020-12-30 DIAGNOSIS — Z8719 Personal history of other diseases of the digestive system: Secondary | ICD-10-CM | POA: Insufficient documentation

## 2020-12-30 NOTE — Patient Instructions (Signed)
You have been scheduled for an MRI at [facility] on 01/09/21. Your appointment time is 7:00 am. Please arrive to admitting (at main entrance of the hospital) 30 minutes prior to your appointment time for registration purposes. Please make certain not to have anything to eat or drink 6 hours prior to your test. In addition, if you have any metal in your body, have a pacemaker or defibrillator, please be sure to let your ordering physician know. This test typically takes 45 minutes to 1 hour to complete. Should you need to reschedule, please call [phone] to do so. ________________________________________________________  Please purchase the following medications over the counter and take as directed: Dulcolax 1 tablet every evening Colace 1 table every evening ________________________________________________________  If you are age 3 or older, your body mass index should be between 23-30. Your Body mass index is 30.77 kg/m. If this is out of the aforementioned range listed, please consider follow up with your Primary Care Provider.  If you are age 72 or younger, your body mass index should be between 19-25. Your Body mass index is 30.77 kg/m. If this is out of the aformentioned range listed, please consider follow up with your Primary Care Provider.   ________________________________________________________  The [facility] providers would like to encourage you to use [facility] to communicate with providers for non-urgent requests or questions.  Due to long hold times on the telephone, sending your provider a message by [facility] may be a faster and more efficient way to get a response.  Please allow 48 business hours for a response.  Please remember that this is for non-urgent requests.  _______________________________________________________  Due to recent changes in healthcare laws, you may see the results of your imaging and laboratory studies on MyChart before your provider has had a  chance to review them.  We understand that in some cases there may be results that are confusing or concerning to you. Not all laboratory results come back in the same time frame and the provider may be waiting for multiple results in order to interpret others.  Please give [place] 48 hours in order for your provider to thoroughly review all the results before contacting the office for clarification of your results.

## 2020-12-30 NOTE — Progress Notes (Signed)
Subjective:    Patient ID: Kurt Hall, male    DOB: 03/04/1945, 75 y.o.   MRN: [id number]  HPI Kurt Hall is a pleasant 75 year old white male, known to Dr. Hilarie Hall, who comes in today for follow-up, and to discuss follow-up MRI, as well as chronic constipation.  He was last seen here in March 2021. He has history of adenomatous and sessile serrated polyps, GERD, history of small bowel obstruction status post lysis of adhesions in 2014, remote appendectomy and history of prostate cancer for which she underwent prostatectomy. Last colonoscopy November 2020 with removal of 4 polyps, largest 4 mm, noted mild radiation proctitis and internal hemorrhoids.  Path on the polyps consistent with a tubular adenoma x1, sessile serrated polyp x2 and hyperplastic polyp x1.  Plan for 5-year interval follow-up. Patient had undergone MRI of the abdomen at [facility] in March 2021 for follow-up of a complex renal cyst.  This showed a 1.4 cm enhancing focus arising from the superior pole of the right kidney which remains indeterminant though favored to represent renal cortical scarring, follow-up MRI recommended in 6 months, unchanged scattered tiny cystic lesions within the pancreas compatible with sidebranch IPMN's liver with normal morphology and enhancement, redemonstrated multiple hepatic cysts. Patient has not had follow-up MRI to date. He had in the past been given a trial of Amitiza for the constipation but says that caused diarrhea.  He is currently using 1 Dulcolax tablet every other day and has had some improvement in his bowel movements with that.  He has been having some issues with nighttime gas and abdominal discomfort which keeps him awake at times.  He relates this to constipation and need for a bowel movement and does not feel that he is evacuating his bowels well.  He has not noticed any melena or hematochezia.  With the Dulcolax every other day he usually will have a small bowel movement daily.  Appetite  has been okay, weight stable.  Review of Systems Pertinent positive and negative review of systems were noted in the above HPI section.  All other review of systems was otherwise negative.   Outpatient Encounter Medications as of 12/30/2020  Medication Sig   amLODipine (NORVASC) 10 MG tablet Take 10 mg by mouth daily.   aspirin 81 MG tablet Take 81 mg by mouth daily.   atorvastatin (LIPITOR) 20 MG tablet Take 20 mg by mouth daily.   clonazePAM (KLONOPIN) 0.5 MG tablet Take 0.5 mg by mouth 2 (two) times daily as needed.   hydrochlorothiazide (HYDRODIURIL) 12.5 MG tablet Take 12.5 mg by mouth every morning.   loratadine (CLARITIN) 10 MG tablet Take 10 mg by mouth daily.   losartan (COZAAR) 100 MG tablet Take 100 mg by mouth daily.   omeprazole (PRILOSEC) 20 MG capsule Take 1 capsule (20 mg total) by mouth daily before breakfast. NEED APPOINTMENT FOR FURTHER REFILLS   Potassium 99 MG TABS Take 99 mg by mouth daily.   [DISCONTINUED] amLODipine (NORVASC) 10 MG tablet Take 10 mg by mouth daily.   [DISCONTINUED] atorvastatin (LIPITOR) 20 MG tablet Take 20 mg by mouth daily.   [DISCONTINUED] aspirin 81 MG chewable tablet Chew 81 mg by mouth daily.   [DISCONTINUED] losartan-hydrochlorothiazide (HYZAAR) 100-25 MG tablet Take 1 tablet by mouth daily.   [DISCONTINUED] lubiprostone (AMITIZA) 24 MCG capsule Take 1 capsule (24 mcg total) by mouth 2 (two) times daily with a meal.   [DISCONTINUED] Multiple Vitamins-Minerals (MULTIVITAMIN PO) Take by mouth daily.   No facility-administered encounter  medications on file as of 12/30/2020.   No Known Allergies Patient Active Problem List   Diagnosis Date Noted   Hx SBO 12/30/2020   HTN (hypertension) 12/22/2011   History of adenomatous polyp of colon 12/22/2011   GERD (gastroesophageal reflux disease) 12/22/2011   Prostate cancer (Kurt Hall) 12/22/2011   Social History   Socioeconomic History   Marital status: Divorced    Spouse name: Not on file   Number  of children: 2   Years of education: Not on file   Highest education level: Not on file  Occupational History   Occupation: Retired    Fish farm manager: [employer]  Tobacco Use   Smoking status: Former    Types: Cigarettes   Smokeless tobacco: Never  Vaping Use   Vaping Use: Never used  Substance and Sexual Activity   Alcohol use: Yes    Alcohol/week: 0.0 standard drinks    Comment: socially   Drug use: No   Sexual activity: Not on file  Other Topics Concern   Not on file  Social History Narrative   Not on file   Social Determinants of Health   Financial Resource Strain: Not on file  Food Insecurity: Not on file  Transportation Needs: Not on file  Physical Activity: Not on file  Stress: Not on file  Social Connections: Not on file  Intimate Partner Violence: Not on file    Mr. Kurt Hall's family history includes CVA in his father; Colon cancer in his mother; Colon cancer (age of onset: 37) in his sister; Diabetes in his brother; Heart attack in his brother; Hypertension in his brother, father, and mother.      Objective:    Vitals:   12/30/20 0840  BP: 120/74  Pulse: 81    Physical Exam Well-developed well-nourished older white male in no acute distress.  Height, Weight, 202 BMI 30.7  HEENT; nontraumatic normocephalic, EOMI, PE R LA, sclera anicteric. Oropharynx; not examined today Neck; supple, no JVD Cardiovascular; regular rate and rhythm with S1-S2, no murmur rub or gallop Pulmonary; Clear bilaterally Abdomen; soft, there is some very mild tenderness in the left mid quadrant, no guarding, nondistended, no palpable mass or hepatosplenomegaly, bowel sounds are active, appendectomy scar Rectal; not done today Skin; benign exam, no jaundice rash or appreciable lesions Extremities; no clubbing cyanosis or edema skin warm and dry Neuro/Psych; alert and oriented x4, grossly nonfocal mood and affect appropriate        Assessment & Plan:   #23 75 year old white  male with history of adenomatous and sessile serrated polyps, up-to-date with colonoscopy as above, due for follow-up November 2025  #2 chronic constipation-continues to be symptomatic and also has complaints of intermittent gassiness and discomfort which sometimes bothers him at night. Suspect some of his symptoms are secondary to underlying adhesive disease status post remote appendectomy, history of small bowel obstruction with lysis of adhesions 2014, and prostatectomy.  #3 abnormal MRI of the abdomen March 2021, multiple hepatic cysts, unchanged multiple scattered cystic lesions largest in the uncinate process measuring about 0.8 cm and most consistent with sidebranch IPMN's, also with multifocal right renal cortical thinning/scarring and multiple renal cysts , largest measuring 1.4 x 1.3 cm  Recommended for 55-month interval follow-up\  #4 History of prostate cancer, status post prostatectomy #5.,  GERD stable #6.  Hypertension #7.  Chronic kidney disease  Plan; Continue liberal water intake Increase Dulcolax to 1 p.o. every afternoon, add Colace 1 p.o. every afternoon.  Patient is asked to try  this regimen over the next couple of weeks, if this is not beneficial he will call back.  We could consider a bowel purge at that point and then perhaps a trial of low-dose Linzess.  Patient will be scheduled for MRI of the abdomen.  Further recommendations pending findings.  Plan follow-up colonoscopy 2025      Alfredia Ferguson PA-C 12/30/2020   Cc: Antony Contras, MD

## 2020-12-30 NOTE — Progress Notes (Signed)
Addendum: Reviewed and agree with assessment and management plan. Mechille Varghese M, MD  

## 2021-01-09 ENCOUNTER — Other Ambulatory Visit: Payer: Self-pay | Admitting: Internal Medicine

## 2021-01-09 ENCOUNTER — Ambulatory Visit (HOSPITAL_COMMUNITY)
Admission: RE | Admit: 2021-01-09 | Discharge: 2021-01-09 | Disposition: A | Discharge status: Home / Self Care | Payer: Medicare Other | Source: Ambulatory Visit | Attending: Internal Medicine | Admitting: Internal Medicine

## 2021-01-09 DIAGNOSIS — K862 Cyst of pancreas: Secondary | ICD-10-CM | POA: Diagnosis not present

## 2021-01-09 DIAGNOSIS — K7689 Other specified diseases of liver: Secondary | ICD-10-CM

## 2021-01-09 DIAGNOSIS — N281 Cyst of kidney, acquired: Secondary | ICD-10-CM | POA: Diagnosis not present

## 2021-01-09 DIAGNOSIS — I700 Atherosclerosis of aorta: Secondary | ICD-10-CM | POA: Diagnosis not present

## 2021-01-09 IMAGING — MR MR 3D RECON AT [facility]
18 of 19 series · 46 of 48 positions shown · IV contrast (10 ML GADAVIST)
Comparison: None.  No prior imaging or reports available.

CLINICAL DATA: Follow-up of pancreatic and liver cysts.

EXAM:
MRI ABDOMEN WITHOUT AND WITH CONTRAST
TECHNIQUE: Multiplanar multisequence MR imaging of the abdomen was performed
both before and after the administration of intravenous contrast.
CONTRAST:  10mL GADAVIST GADOBUTROL 1 MMOL/ML IV SOLN

[Series 4: T2 fat-sat · axial · 6.0mm · 1.25mm/px · z∈[-156,+96]mm · 2 of 36 slices shown]
[im 1/36]
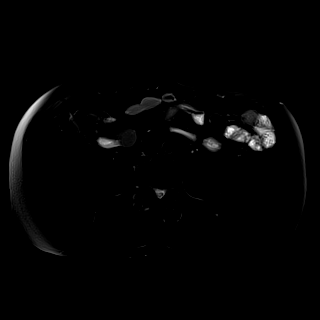
[im 36/36]
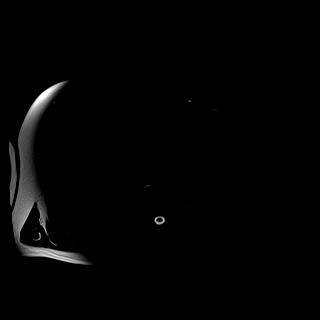

[Series 6: DWI · axial · 6.0mm · 1.49mm/px · z∈[-156,+118]mm · 4 of 77 slices shown (1 of 2)]
[im 1/77]
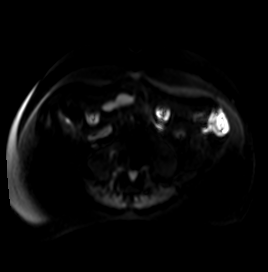
[im 26/77]
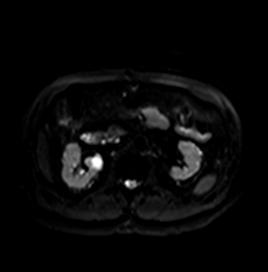
[im 51/77]
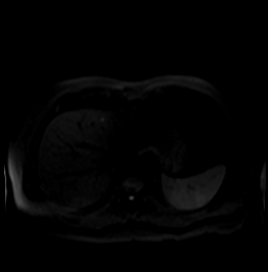
[im 77/77]
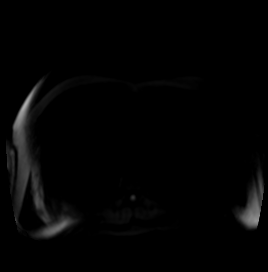

[Series 7: DWI · axial · 6.0mm · 1.49mm/px · z∈[-156,+118]mm · 2 of 39 slices shown (2 of 2)]
[im 1/39]
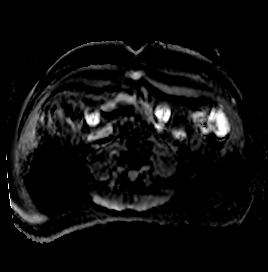
[im 39/39]
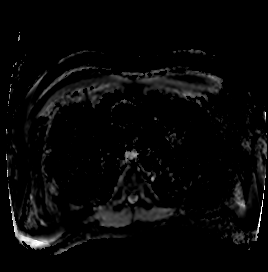

[Series 8: T2 · coronal · 7.0mm · 1.56mm/px · 1 of 30 slices shown (1 of 2)]
[im 1/30]
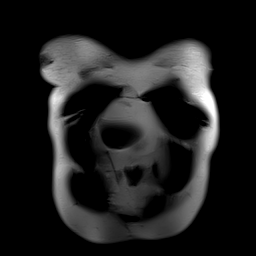

[Series 9: T1 · axial · 3.1mm · 1.25mm/px · z∈[-173,+97]mm · 3 of 88 slices shown (1 of 2)]
[im 1/88]
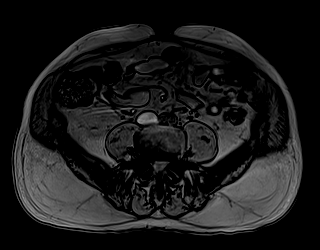
[im 44/88]
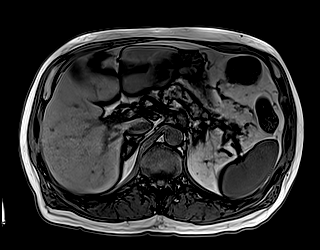
[im 88/88]
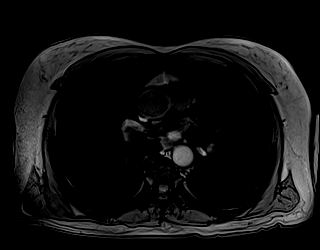

[Series 10: T1 · axial · 3.1mm · 1.25mm/px · z∈[-173,+97]mm · 3 of 88 slices shown (2 of 2)]
[im 1/88]
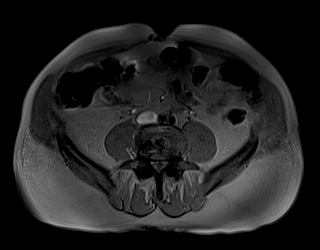
[im 44/88]
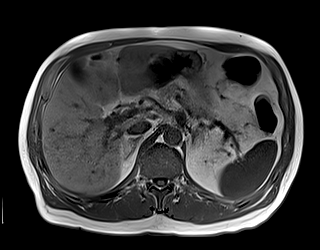
[im 88/88]
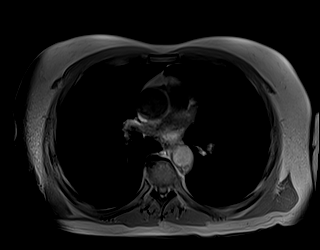

[Series 12: bSSFP · axial · 7.0mm · 1.25mm/px · 1 of 36 slices shown]
[im 1/36]
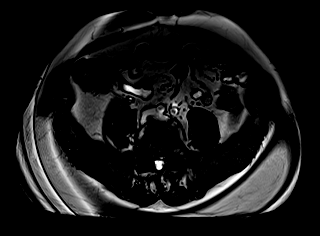

[Series 14: T1 dynamic · axial · 3.0mm · 1.25mm/px · z∈[-164,+97]mm · 3 of 88 slices shown (1 of 10)]
[im 1/88]
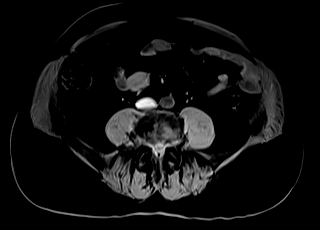
[im 44/88]
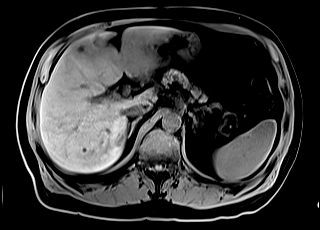
[im 88/88]
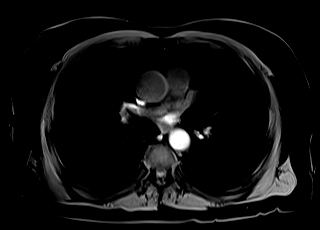

[Series 18: T1 dynamic · axial · 3.0mm · 1.25mm/px · z∈[-164,+97]mm · 3 of 88 slices shown (2 of 10)]
[im 1/88]
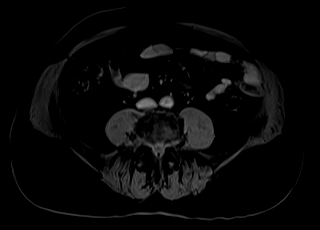
[im 44/88]
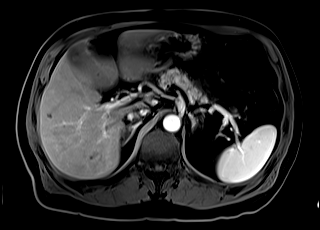
[im 88/88]
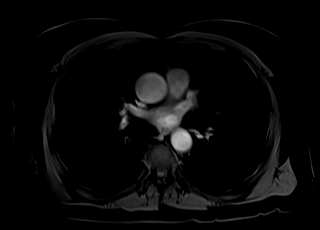

[Series 19: T1 dynamic · axial · 3.0mm · 1.25mm/px · z∈[-164,+97]mm · 3 of 88 slices shown (3 of 10)]
[im 1/88]
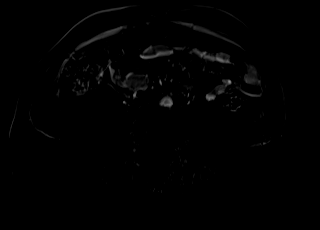
[im 44/88]
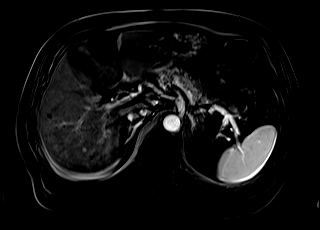
[im 88/88]
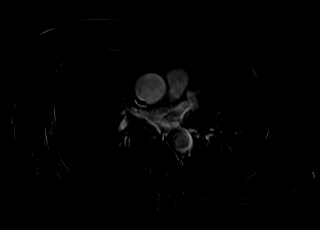

[Series 22: T1 dynamic · axial · 3.0mm · 1.25mm/px · z∈[-164,+97]mm · 3 of 88 slices shown (4 of 10)]
[im 1/88]
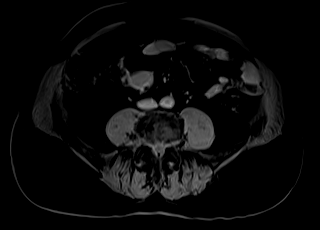
[im 44/88]
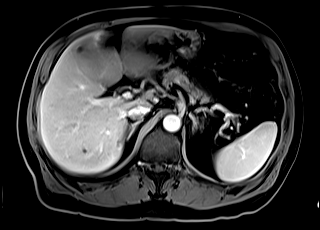
[im 88/88]
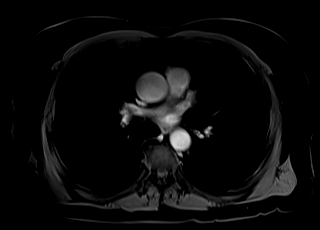

[Series 23: T1 dynamic · axial · 3.0mm · 1.25mm/px · z∈[-164,+97]mm · 3 of 88 slices shown (5 of 10)]
[im 1/88]
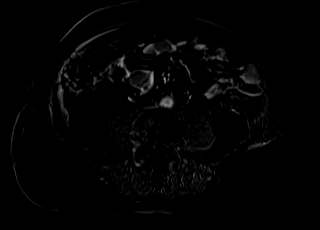
[im 44/88]
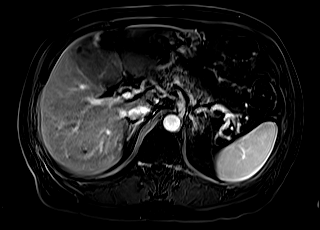
[im 88/88]
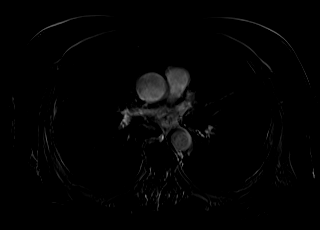

[Series 26: T1 dynamic · axial · 3.0mm · 1.25mm/px · z∈[-164,+97]mm · 3 of 88 slices shown (6 of 10)]
[im 1/88]
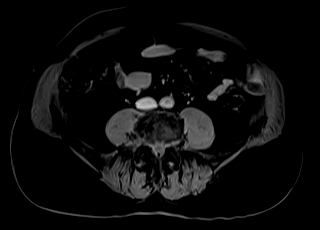
[im 44/88]
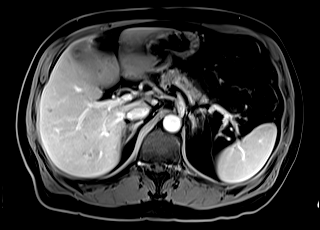
[im 88/88]
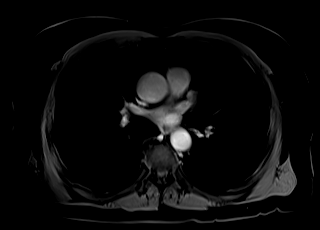

[Series 27: T1 dynamic · axial · 3.0mm · 1.25mm/px · z∈[-164,+97]mm · 3 of 88 slices shown (7 of 10)]
[im 1/88]
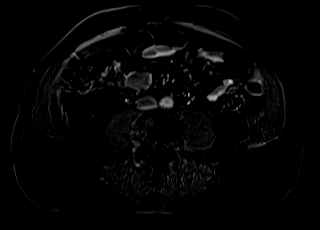
[im 44/88]
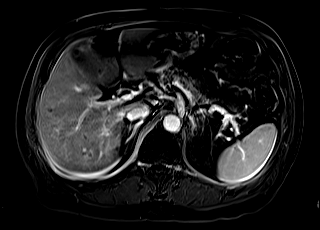
[im 88/88]
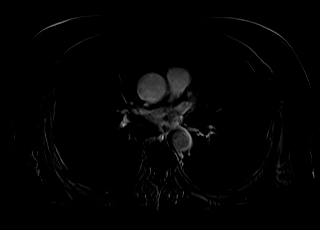

[Series 29: T1 dynamic · coronal · 4.0mm · 1.41mm/px · 2 of 60 slices shown (8 of 10)]
[im 1/60]
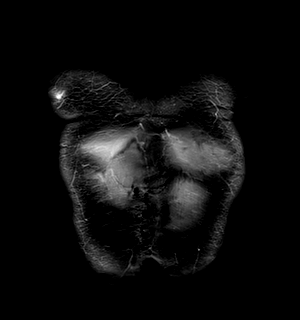
[im 60/60]
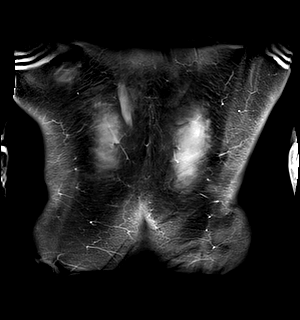

[Series 30: T2 · axial · 6.0mm · 1.56mm/px · 1 of 38 slices shown (2 of 2)]
[im 1/38]
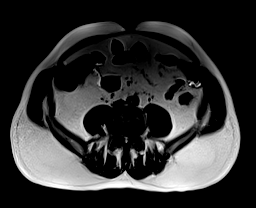

[Series 33: T1 dynamic · axial · 3.0mm · 1.25mm/px · z∈[-164,+97]mm · 3 of 88 slices shown (9 of 10)]
[im 1/88]
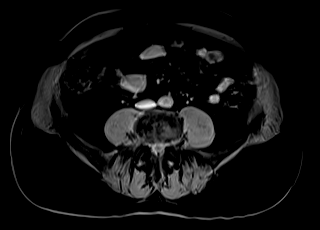
[im 44/88]
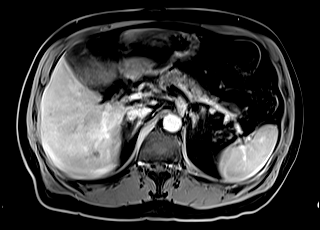
[im 88/88]
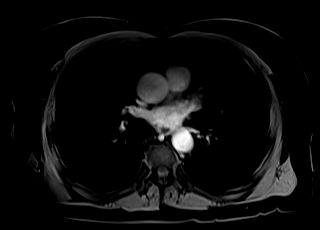

[Series 34: T1 dynamic · axial · 3.0mm · 1.25mm/px · z∈[-164,+97]mm · 3 of 88 slices shown (10 of 10)]
[im 1/88]
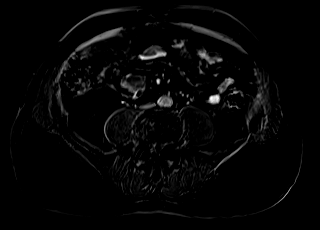
[im 44/88]
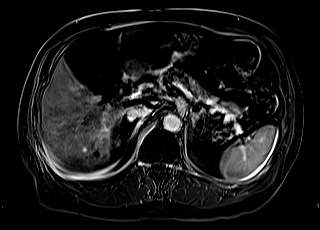
[im 88/88]
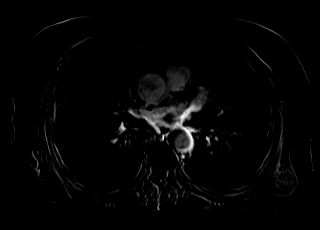

[46 of 48 positions shown; findings below may reference images not displayed]

FINDINGS: Lower chest: Normal heart size without pericardial or pleural
effusion.

Hepatobiliary: Multiple T2 hyperintense liver lesions, including up
to 1.0 cm, most consistent with cysts and bile duct hamartomas. No
suspicious liver lesion. Normal gallbladder, without biliary ductal
dilatation.

Pancreas: Multiple tiny pancreatic cystic lesions are identified.
Example uncinate process 5 mm on [DATE]. Dorsal pancreatic body at 3
mm on [DATE]. Within the pancreatic tail at 2 mm on [DATE]. Within the
pancreatic head at 2 mm x 2 on [DATE]. No main pancreatic duct
dilatation or acute inflammation.

Spleen:  Normal in size, without focal abnormality.

Adrenals/Urinary Tract: Normal adrenal glands. Bilateral renal cysts
and too small to characterize lesions. The largest left-sided cyst
is in the lower pole at 1.3 cm. The largest right-sided cyst is in
the upper pole at 2.2 cm. Renal cortical thinning, greater on the
right than left. No hydronephrosis.

Stomach/Bowel: Normal stomach and abdominal bowel loops.

Vascular/Lymphatic: Aortic atherosclerosis. No retroperitoneal or
retrocrural adenopathy.

Other:  No ascites.

Musculoskeletal: No acute osseous abnormality.
IMPRESSION: 1. Tiny pancreatic cystic lesions are most likely pseudocysts versus
indolent cystic neoplasms. Per consensus criteria, follow-up with
pre and post contrast abdominal MRI/MRCP at 2 years is recommended.
This recommendation follows ACR consensus guidelines: Management of
Incidental Pancreatic Cysts: A White Paper of the ACR Incidental
Findings Committee. [HOSPITAL] [FI];[DATE].
2.  No acute abdominal process.
3.  Aortic Atherosclerosis ([FI]-[FI]).

## 2021-01-09 IMAGING — MR MR ABDOMEN WO/W CM
18 of 20 series · 45 of 48 positions shown · IV contrast (gadavist)
Comparison: None.  No prior imaging or reports available.

CLINICAL DATA: Follow-up of pancreatic and liver cysts.

EXAM:
MRI ABDOMEN WITHOUT AND WITH CONTRAST
TECHNIQUE: Multiplanar multisequence MR imaging of the abdomen was performed
both before and after the administration of intravenous contrast.
CONTRAST:  10mL GADAVIST GADOBUTROL 1 MMOL/ML IV SOLN

[Series 3: T2 fat-sat · axial · 6.0mm · 1.25mm/px · z∈[-156,+96]mm · 2 of 36 slices shown]
[im 1/36]
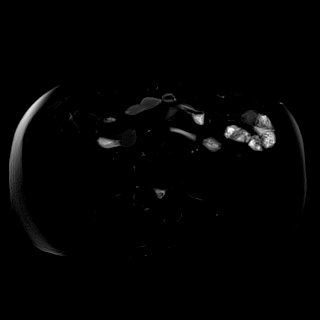
[im 36/36]
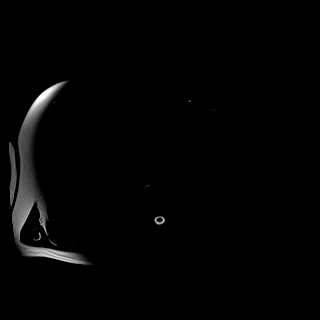

[Series 5: DWI · axial · 6.0mm · 1.49mm/px · z∈[-156,+118]mm · 4 of 77 slices shown (1 of 2)]
[im 1/77]
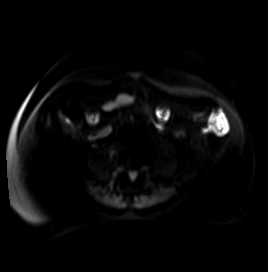
[im 26/77]
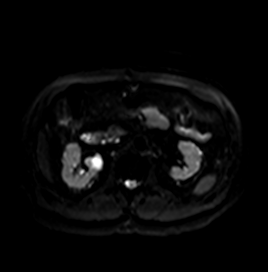
[im 51/77]
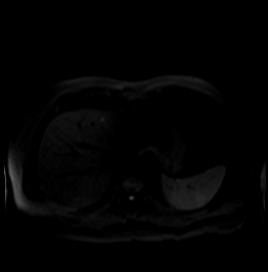
[im 77/77]
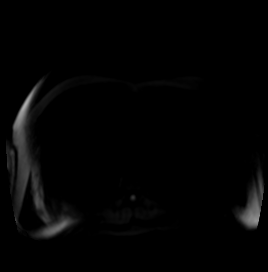

[Series 6: DWI · axial · 6.0mm · 1.49mm/px · 1 of 39 slices shown (2 of 2)]
[im 1/39]
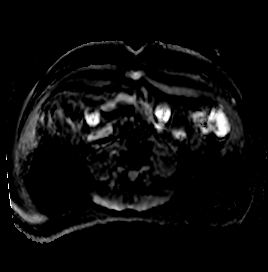

[Series 7: T2 · coronal · 7.0mm · 1.56mm/px · 1 of 30 slices shown (1 of 2)]
[im 1/30]
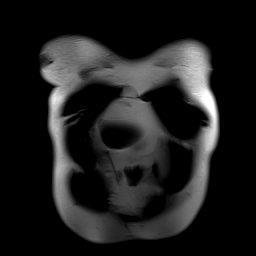

[Series 8: T1 · axial · 3.1mm · 1.25mm/px · z∈[-173,+97]mm · 3 of 88 slices shown (1 of 2)]
[im 1/88]
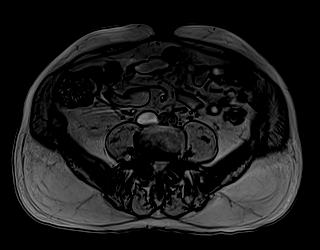
[im 44/88]
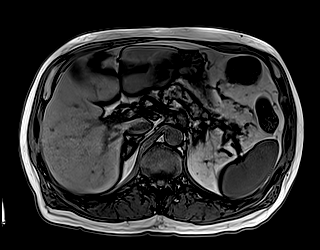
[im 88/88]
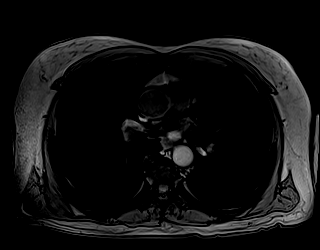

[Series 9: T1 · axial · 3.1mm · 1.25mm/px · z∈[-173,+97]mm · 3 of 88 slices shown (2 of 2)]
[im 1/88]
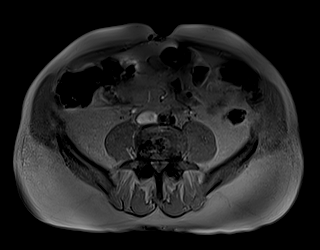
[im 44/88]
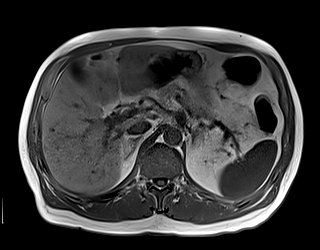
[im 88/88]
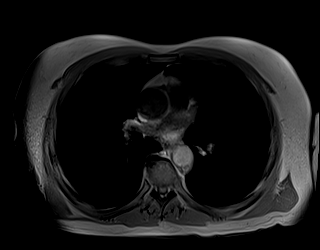

[Series 11: bSSFP · axial · 7.0mm · 1.25mm/px · 1 of 36 slices shown]
[im 1/36]
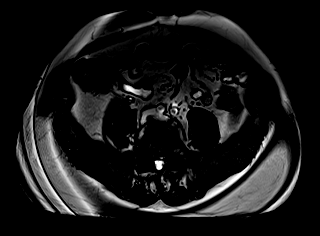

[Series 13: T1 dynamic · axial · 3.0mm · 1.25mm/px · z∈[-164,+97]mm · 3 of 88 slices shown (1 of 10)]
[im 1/88]
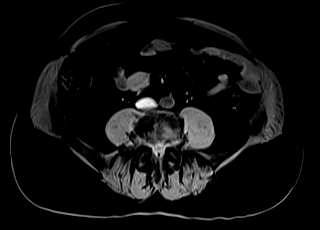
[im 44/88]
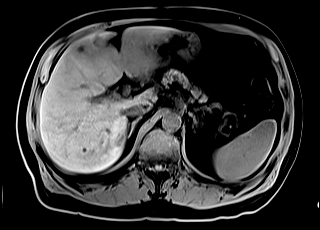
[im 88/88]
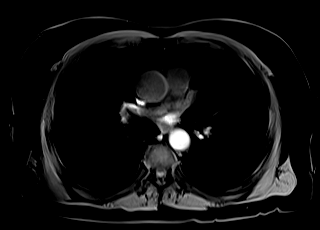

[Series 17: T1 dynamic · axial · 3.0mm · 1.25mm/px · z∈[-164,+97]mm · 3 of 88 slices shown (2 of 10)]
[im 1/88]
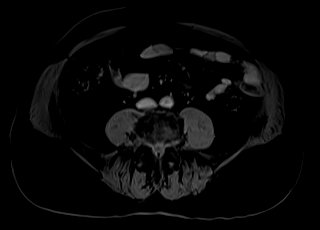
[im 44/88]
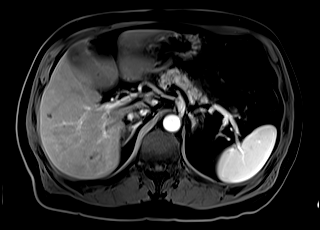
[im 88/88]
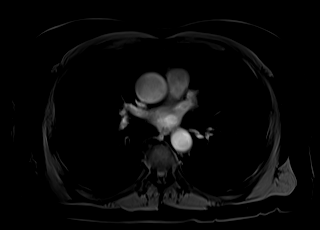

[Series 18: T1 dynamic · axial · 3.0mm · 1.25mm/px · z∈[-164,+97]mm · 3 of 88 slices shown (3 of 10)]
[im 1/88]
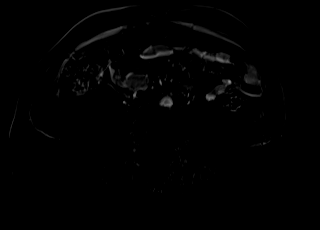
[im 44/88]
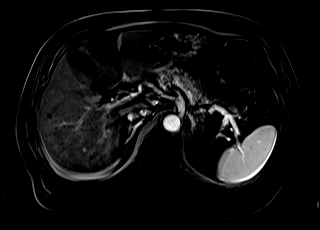
[im 88/88]
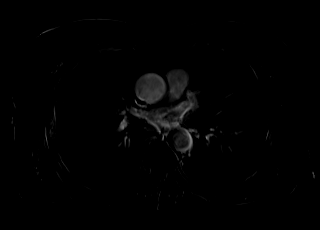

[Series 21: T1 dynamic · axial · 3.0mm · 1.25mm/px · z∈[-164,+97]mm · 3 of 88 slices shown (4 of 10)]
[im 1/88]
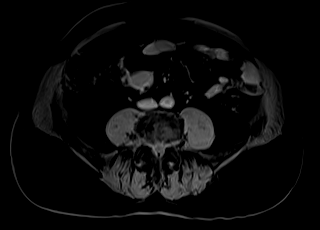
[im 44/88]
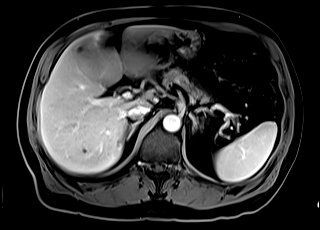
[im 88/88]
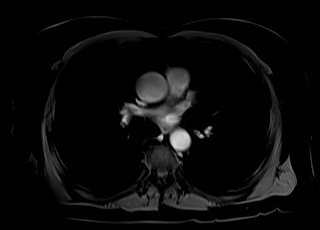

[Series 22: T1 dynamic · axial · 3.0mm · 1.25mm/px · z∈[-164,+97]mm · 3 of 88 slices shown (5 of 10)]
[im 1/88]
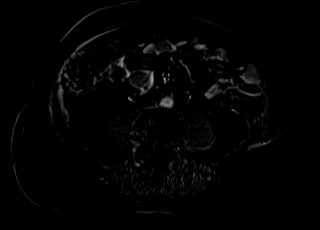
[im 44/88]
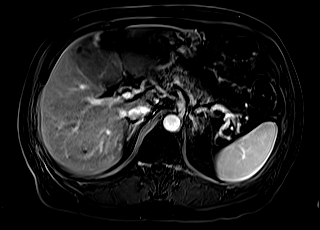
[im 88/88]
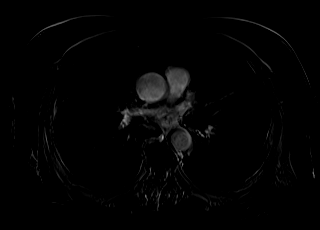

[Series 25: T1 dynamic · axial · 3.0mm · 1.25mm/px · z∈[-164,+97]mm · 3 of 88 slices shown (6 of 10)]
[im 1/88]
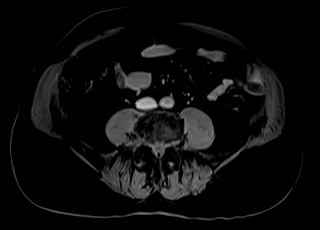
[im 44/88]
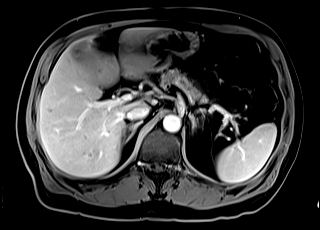
[im 88/88]
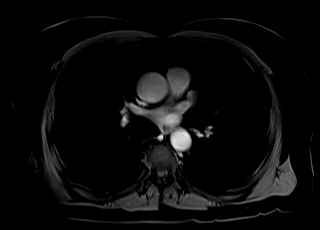

[Series 26: T1 dynamic · axial · 3.0mm · 1.25mm/px · z∈[-164,+97]mm · 3 of 88 slices shown (7 of 10)]
[im 1/88]
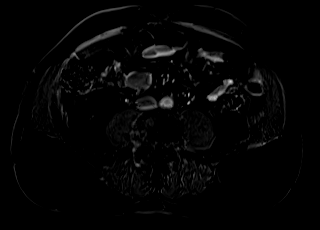
[im 44/88]
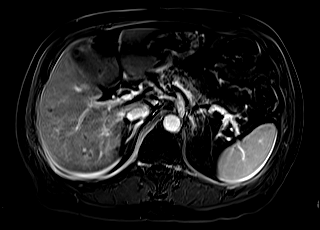
[im 88/88]
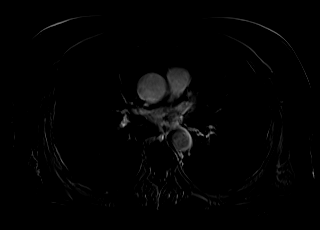

[Series 28: T1 dynamic · coronal · 4.0mm · 1.41mm/px · 2 of 60 slices shown (8 of 10)]
[im 1/60]
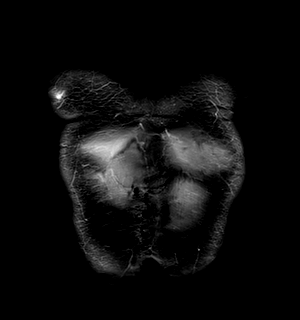
[im 60/60]
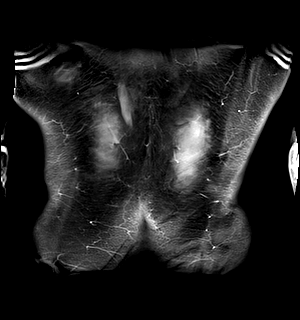

[Series 29: T2 · axial · 6.0mm · 1.56mm/px · 1 of 38 slices shown (2 of 2)]
[im 1/38]
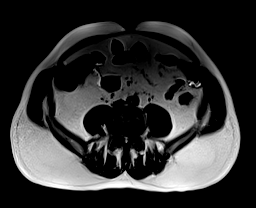

[Series 32: T1 dynamic · axial · 3.0mm · 1.25mm/px · z∈[-164,+97]mm · 3 of 88 slices shown (9 of 10)]
[im 1/88]
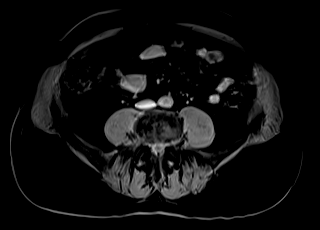
[im 44/88]
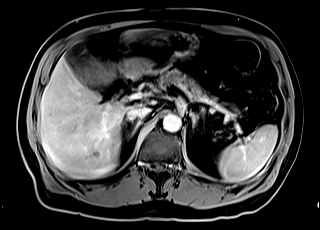
[im 88/88]
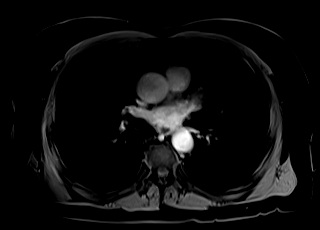

[Series 33: T1 dynamic · axial · 3.0mm · 1.25mm/px · z∈[-164,+97]mm · 3 of 88 slices shown (10 of 10)]
[im 1/88]
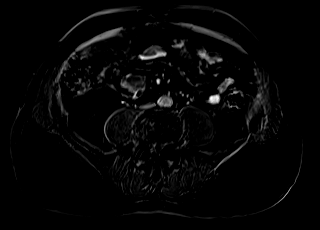
[im 44/88]
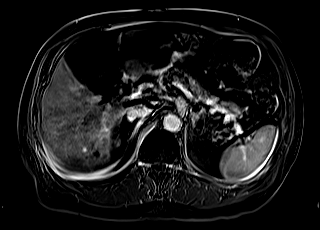
[im 88/88]
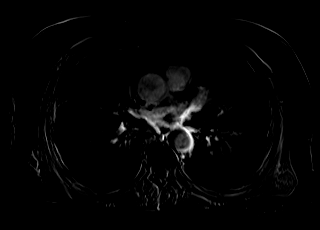

[45 of 48 positions shown; findings below may reference images not displayed]

FINDINGS: Lower chest: Normal heart size without pericardial or pleural
effusion.

Hepatobiliary: Multiple T2 hyperintense liver lesions, including up
to 1.0 cm, most consistent with cysts and bile duct hamartomas. No
suspicious liver lesion. Normal gallbladder, without biliary ductal
dilatation.

Pancreas: Multiple tiny pancreatic cystic lesions are identified.
Example uncinate process 5 mm on [DATE]. Dorsal pancreatic body at 3
mm on [DATE]. Within the pancreatic tail at 2 mm on [DATE]. Within the
pancreatic head at 2 mm x 2 on [DATE]. No main pancreatic duct
dilatation or acute inflammation.

Spleen:  Normal in size, without focal abnormality.

Adrenals/Urinary Tract: Normal adrenal glands. Bilateral renal cysts
and too small to characterize lesions. The largest left-sided cyst
is in the lower pole at 1.3 cm. The largest right-sided cyst is in
the upper pole at 2.2 cm. Renal cortical thinning, greater on the
right than left. No hydronephrosis.

Stomach/Bowel: Normal stomach and abdominal bowel loops.

Vascular/Lymphatic: Aortic atherosclerosis. No retroperitoneal or
retrocrural adenopathy.

Other:  No ascites.

Musculoskeletal: No acute osseous abnormality.
IMPRESSION: 1. Tiny pancreatic cystic lesions are most likely pseudocysts versus
indolent cystic neoplasms. Per consensus criteria, follow-up with
pre and post contrast abdominal MRI/MRCP at 2 years is recommended.
This recommendation follows ACR consensus guidelines: Management of
Incidental Pancreatic Cysts: A White Paper of the ACR Incidental
Findings Committee. [HOSPITAL] [FI];[DATE].
2.  No acute abdominal process.
3.  Aortic Atherosclerosis ([FI]-[FI]).

## 2021-01-09 MED ORDER — GADOBUTROL 1 MMOL/ML IV SOLN
10.0000 mL | Freq: Once | INTRAVENOUS | Status: AC | PRN
  Administered 2021-01-09: 10 mL via INTRAVENOUS

## 2021-01-27 ENCOUNTER — Ambulatory Visit: Payer: Medicare Other | Admitting: Physician Assistant

## 2021-01-27 ENCOUNTER — Encounter: Payer: Self-pay | Admitting: Physician Assistant

## 2021-01-27 VITALS — BP 136/90 | HR 76 | Ht 67.0 in | Wt 203.1 lb

## 2021-01-27 DIAGNOSIS — K5909 Other constipation: Secondary | ICD-10-CM

## 2021-01-27 DIAGNOSIS — R143 Flatulence: Secondary | ICD-10-CM | POA: Diagnosis not present

## 2021-01-27 DIAGNOSIS — K219 Gastro-esophageal reflux disease without esophagitis: Secondary | ICD-10-CM

## 2021-01-27 MED ORDER — OMEPRAZOLE 20 MG PO CPDR
20.0000 mg | DELAYED_RELEASE_CAPSULE | Freq: Every day | ORAL | 2 refills | Status: DC
Start: 2021-01-27 — End: 2022-12-21

## 2021-01-27 NOTE — Progress Notes (Signed)
Subjective:    Patient ID: Kurt Hall, male    DOB: [birth year], 75 y.o.   MRN: [id number]  HPI Kurt Hall is a pleasant 75 year old white male, known to Dr. Hilarie Fredrickson who was recently seen by myself on 12/30/2020 and comes in today for follow-up.  He has history of chronic constipation, adenomatous and sessile serrated polyps, chronic GERD and history of small bowel obstruction status post lysis of adhesions in 2014. He status post remote appendectomy, and prostatectomy and had a bowel obstruction in 2014 for which he underwent lysis of adhesions. Last colon was in November 2020 with removal of 4 polyps, largest 4 mm did note mild radiation proctitis and internal hemorrhoids.  Polyps were consistent with 1 tubular adenoma, 2 sessile serrated polyps and 1 hyperplastic polyp.  Indicated for 5-year interval follow-up. Since his last office visit he has undergone follow-up MRI of the abdomen to reassess multiple hepatic cysts, multiple scattered cystic lesions in the uncinate process of the pancreas measuring about 0.8 cm and most consistent with sidebranch IPMN ends which had been noted on prior MRI in March 2021.  This most recent MRI shows the liver cyst to be stable, and the tiny pancreatic cystic lesions most likely pseudocyst versus indolent cystic neoplasms, stable in size and recommendation was for MRI/MRCP in 2 years.  There was no evidence of small bowel or colonic dilation, no gallstones.  He had been having a lot of problems with gas and abdominal discomfort keeping him awake at nighttime.  I believe this is secondary to underlying adhesive disease and chronic constipation. He tried taking 1 Dulcolax daily and 1 Colace daily but did not notice any significant change with the addition of the Colace. He says he is feeling good today because he took 2 Dulcolax on Friday evening and was able to get his bowels completely cleaned out.  He says he feels much better after this and has no complaints of  abdominal discomfort or gassiness today.  He is asking how to proceed forward as far as laxative use. He also mentions that sometimes in the evenings after eating dinner he will feel sort of bloated and uncomfortable in his upper abdomen and gets some mild nausea.  Review of Systems Pertinent positive and negative review of systems were noted in the above HPI section.  All other review of systems was otherwise negative.   Outpatient Encounter Medications as of 01/27/2021  Medication Sig   amLODipine (NORVASC) 10 MG tablet Take 10 mg by mouth daily.   aspirin 81 MG tablet Take 81 mg by mouth daily.   atorvastatin (LIPITOR) 20 MG tablet Take 20 mg by mouth daily.   clonazePAM (KLONOPIN) 0.5 MG tablet Take 0.5 mg by mouth at bedtime.   hydrochlorothiazide (HYDRODIURIL) 12.5 MG tablet Take 12.5 mg by mouth every morning.   loratadine (CLARITIN) 10 MG tablet Take 10 mg by mouth daily.   losartan (COZAAR) 100 MG tablet Take 100 mg by mouth daily.   Potassium 99 MG TABS Take 99 mg by mouth daily.   [DISCONTINUED] omeprazole (PRILOSEC) 20 MG capsule Take 1 capsule (20 mg total) by mouth daily before breakfast. NEED APPOINTMENT FOR FURTHER REFILLS   omeprazole (PRILOSEC) 20 MG capsule Take 1 capsule (20 mg total) by mouth daily before breakfast.   No facility-administered encounter medications on file as of 01/27/2021.   No Known Allergies Patient Active Problem List   Diagnosis Date Noted   Hx SBO 12/30/2020   HTN (hypertension) 12/22/2011  History of adenomatous polyp of colon 12/22/2011   GERD (gastroesophageal reflux disease) 12/22/2011   Prostate cancer ([place]) 12/22/2011   Social History   Socioeconomic History   Marital status: Divorced    Spouse name: Not on file   Number of children: 2   Years of education: Not on file   Highest education level: Not on file  Occupational History   Occupation: Retired    Fish farm manager: [employer]  Tobacco Use   Smoking status: Former     Types: Cigarettes   Smokeless tobacco: Never  Vaping Use   Vaping Use: Never used  Substance and Sexual Activity   Alcohol use: Yes    Alcohol/week: 0.0 standard drinks    Comment: socially   Drug use: No   Sexual activity: Not on file  Other Topics Concern   Not on file  Social History Narrative   Not on file   Social Determinants of Health   Financial Resource Strain: Not on file  Food Insecurity: Not on file  Transportation Needs: Not on file  Physical Activity: Not on file  Stress: Not on file  Social Connections: Not on file  Intimate Partner Violence: Not on file    Kurt Hall's family history includes CVA in his father; Colon cancer in his mother; Colon cancer (age of onset: 74) in his sister; Diabetes in his brother; Heart attack in his brother; Hypertension in his brother, father, and mother.      Objective:    Vitals:   01/27/21 1438  BP: 136/90  Pulse: 76    Physical Exam Well-developed well-nourished elderly WM in no acute distress. Pleasant  Weight 203, BMI 31.8  HEENT; nontraumatic normocephalic, EOMI, PE R LA, sclera anicteric. Oropharynx; not examined today Neck; supple, no JVD Cardiovascular; regular rate and rhythm with S1-S2, no murmur rub or gallop Pulmonary; Clear bilaterally Abdomen; soft, nontender, nondistended, incisional scars, no palpable mass or hepatosplenomegaly, bowel sounds are active Rectal; not done today Skin; benign exam, no jaundice rash or appreciable lesions Extremities; no clubbing cyanosis or edema skin warm and dry Neuro/Psych; alert and oriented x4, grossly nonfocal mood and affect appropriate        Assessment & Plan:   #7 75 year old white male with chronic constipation, symptomatic with gassiness and abdominal discomfort particularly at nighttime after he goes to bed. Believe he has underlying adhesive disease.  He is status post remote appendectomy, has history of small bowel obstruction with lysis of adhesions  2014 and has had a prostatectomy for prostate surgery.  Recent MRI reassuring, no evidence of partial obstruction or any dilated loops of bowel.  #2 benign hepatic cystic lesions noted on MRI and tiny pancreatic cystic lesions felt consistent with tiny stable pseudocyst versus indolent cystic neoplasms  Recommendation per radiology is for follow-up MRI/MRCP in 2 years  #3 history of prostate cancer status post prostatectomy 4.  GERD stable 5.  History of adenomatous and sessile serrated polyps-up-to-date with colonoscopy due for follow-up November 2025 #6 chronic kidney disease #7 hypertension  Plan; Patient can give himself a trial of MiraLAX 17 g in 8 ounces of water daily, if this is not effective over the next couple weeks then try Dulcolax 1 tablet every day.  (Amitiza and Linzess in the past cause diarrhea)  We also discussed intermittent purging of bowel once a week or so with 2-3 Dulcolax at a time which has been effective for him  Gas-X or Phazyme 1-2 with evening meal\ He was given  a copy of a low gas diet for review and possible elimination of any high gas producers that he is regularly  consuming Will follow-up with Dr. Hilarie Fredrickson or myself on an as-needed basis.   Kurt Hall Genia Harold PA-C 01/27/2021   Cc: Antony Contras, MD

## 2021-01-27 NOTE — Patient Instructions (Addendum)
If you are age 75 or older, your body mass index should be between 23-30. Your Body mass index is 31.81 kg/m. If this is out of the aforementioned range listed, please consider follow up with your Primary Care Provider. ________________________________________________________  The [place] [facility] providers would like to encourage you to use [facility] to communicate with providers for non-urgent requests or questions.  Due to long hold times on the telephone, sending your provider a message by [facility] may be a faster and more efficient way to get a response.  Please allow 48 business hours for a response.  Please remember that this is for non-urgent requests.  _______________________________________________________  Continue Omeprazole 20 mg 1 capsule every morning.  Try using Miralax 1 capful in 8 ounces of water or juice daily or Dulcolax 1 tablet daily.  Okay to purge bowel with 2-3 dulcolax once weekly or so.  Follow a low gas diet.  Thank you for entrusting me with your care and choosing [facility].  Amy Esterwood, PA-C

## 2021-01-28 NOTE — Progress Notes (Signed)
Addendum: Reviewed and agree with assessment and management plan. Hideko Esselman M, MD  

## 2021-04-08 DIAGNOSIS — L578 Other skin changes due to chronic exposure to nonionizing radiation: Secondary | ICD-10-CM | POA: Diagnosis not present

## 2021-04-08 DIAGNOSIS — D225 Melanocytic nevi of trunk: Secondary | ICD-10-CM | POA: Diagnosis not present

## 2021-04-08 DIAGNOSIS — L570 Actinic keratosis: Secondary | ICD-10-CM | POA: Diagnosis not present

## 2021-04-14 DIAGNOSIS — J309 Allergic rhinitis, unspecified: Secondary | ICD-10-CM | POA: Diagnosis not present

## 2021-04-14 DIAGNOSIS — C61 Malignant neoplasm of prostate: Secondary | ICD-10-CM | POA: Diagnosis not present

## 2021-05-29 DIAGNOSIS — K5900 Constipation, unspecified: Secondary | ICD-10-CM | POA: Diagnosis not present

## 2021-05-29 DIAGNOSIS — E782 Mixed hyperlipidemia: Secondary | ICD-10-CM | POA: Diagnosis not present

## 2021-05-29 DIAGNOSIS — G4700 Insomnia, unspecified: Secondary | ICD-10-CM | POA: Diagnosis not present

## 2021-05-29 DIAGNOSIS — I10 Essential (primary) hypertension: Secondary | ICD-10-CM | POA: Diagnosis not present

## 2021-06-11 DIAGNOSIS — I129 Hypertensive chronic kidney disease with stage 1 through stage 4 chronic kidney disease, or unspecified chronic kidney disease: Secondary | ICD-10-CM | POA: Diagnosis not present

## 2021-06-11 DIAGNOSIS — N1831 Chronic kidney disease, stage 3a: Secondary | ICD-10-CM | POA: Diagnosis not present

## 2021-06-11 DIAGNOSIS — I10 Essential (primary) hypertension: Secondary | ICD-10-CM | POA: Diagnosis not present

## 2021-06-11 DIAGNOSIS — C61 Malignant neoplasm of prostate: Secondary | ICD-10-CM | POA: Diagnosis not present

## 2021-06-11 DIAGNOSIS — K7689 Other specified diseases of liver: Secondary | ICD-10-CM | POA: Diagnosis not present

## 2021-06-11 DIAGNOSIS — N289 Disorder of kidney and ureter, unspecified: Secondary | ICD-10-CM | POA: Diagnosis not present

## 2021-07-29 DIAGNOSIS — H524 Presbyopia: Secondary | ICD-10-CM | POA: Diagnosis not present

## 2021-11-12 DIAGNOSIS — R051 Acute cough: Secondary | ICD-10-CM | POA: Diagnosis not present

## 2021-11-12 DIAGNOSIS — J029 Acute pharyngitis, unspecified: Secondary | ICD-10-CM | POA: Diagnosis not present

## 2021-11-12 DIAGNOSIS — R0981 Nasal congestion: Secondary | ICD-10-CM | POA: Diagnosis not present

## 2021-12-08 DIAGNOSIS — E782 Mixed hyperlipidemia: Secondary | ICD-10-CM | POA: Diagnosis not present

## 2021-12-08 DIAGNOSIS — Z0000 Encounter for general adult medical examination without abnormal findings: Secondary | ICD-10-CM | POA: Diagnosis not present

## 2021-12-08 DIAGNOSIS — I10 Essential (primary) hypertension: Secondary | ICD-10-CM | POA: Diagnosis not present

## 2021-12-08 DIAGNOSIS — J309 Allergic rhinitis, unspecified: Secondary | ICD-10-CM | POA: Diagnosis not present

## 2021-12-17 DIAGNOSIS — N1831 Chronic kidney disease, stage 3a: Secondary | ICD-10-CM | POA: Diagnosis not present

## 2022-01-09 DIAGNOSIS — M25562 Pain in left knee: Secondary | ICD-10-CM | POA: Diagnosis not present

## 2022-01-09 DIAGNOSIS — M25561 Pain in right knee: Secondary | ICD-10-CM | POA: Diagnosis not present

## 2022-02-10 DIAGNOSIS — K080 Exfoliation of teeth due to systemic causes: Secondary | ICD-10-CM | POA: Diagnosis not present

## 2022-02-20 DIAGNOSIS — M25562 Pain in left knee: Secondary | ICD-10-CM | POA: Diagnosis not present

## 2022-02-20 DIAGNOSIS — M25561 Pain in right knee: Secondary | ICD-10-CM | POA: Diagnosis not present

## 2022-03-11 DIAGNOSIS — H0102A Squamous blepharitis right eye, upper and lower eyelids: Secondary | ICD-10-CM | POA: Diagnosis not present

## 2022-03-11 DIAGNOSIS — H02831 Dermatochalasis of right upper eyelid: Secondary | ICD-10-CM | POA: Diagnosis not present

## 2022-03-11 DIAGNOSIS — H0102B Squamous blepharitis left eye, upper and lower eyelids: Secondary | ICD-10-CM | POA: Diagnosis not present

## 2022-03-11 DIAGNOSIS — H02834 Dermatochalasis of left upper eyelid: Secondary | ICD-10-CM | POA: Diagnosis not present

## 2022-04-09 DIAGNOSIS — L578 Other skin changes due to chronic exposure to nonionizing radiation: Secondary | ICD-10-CM | POA: Diagnosis not present

## 2022-04-09 DIAGNOSIS — L570 Actinic keratosis: Secondary | ICD-10-CM | POA: Diagnosis not present

## 2022-04-09 DIAGNOSIS — L821 Other seborrheic keratosis: Secondary | ICD-10-CM | POA: Diagnosis not present

## 2022-04-17 DIAGNOSIS — C61 Malignant neoplasm of prostate: Secondary | ICD-10-CM | POA: Diagnosis not present

## 2022-06-08 DIAGNOSIS — E782 Mixed hyperlipidemia: Secondary | ICD-10-CM | POA: Diagnosis not present

## 2022-06-08 DIAGNOSIS — G4700 Insomnia, unspecified: Secondary | ICD-10-CM | POA: Diagnosis not present

## 2022-06-08 DIAGNOSIS — Z8249 Family history of ischemic heart disease and other diseases of the circulatory system: Secondary | ICD-10-CM | POA: Diagnosis not present

## 2022-06-08 DIAGNOSIS — I10 Essential (primary) hypertension: Secondary | ICD-10-CM | POA: Diagnosis not present

## 2022-06-08 DIAGNOSIS — N1832 Chronic kidney disease, stage 3b: Secondary | ICD-10-CM | POA: Diagnosis not present

## 2022-07-24 ENCOUNTER — Telehealth: Payer: Self-pay | Admitting: Physician Assistant

## 2022-07-24 NOTE — Telephone Encounter (Signed)
Patient called state he has been having a lot of abdominal pain has seen APP in the past and its just getting worst. Seeking further advise.

## 2022-07-27 NOTE — Telephone Encounter (Signed)
Spoke with the patient. He complains of gassiness and abdominal discomfort particularly at nighttime after he goes to bed.  No vomiting. Discomfort is located in his left mid to upper side. Patient is worried he has an ulcer. He is not taking Omeprazole. He does not have acid indigestion or reflux.  He takes laxative once to twice a week. He continues to have difficulty passing stool. Patient last seen at [facility] 01/27/21. No openings on the schedule. Patient understands I will call him as soon as there is an opening.

## 2022-08-03 DIAGNOSIS — K080 Exfoliation of teeth due to systemic causes: Secondary | ICD-10-CM | POA: Diagnosis not present

## 2022-08-07 NOTE — Telephone Encounter (Signed)
Spoke with the patient. Offered an appointment for 08/14/22 at 11:00 am. Patient accepts the appointment.

## 2022-08-10 NOTE — Telephone Encounter (Signed)
Inbound call from patient stating he is unable to make 7/5 appointment. Appointment has been cancelled.

## 2022-08-14 ENCOUNTER — Ambulatory Visit: Payer: Medicare Other | Admitting: Physician Assistant

## 2022-09-15 DIAGNOSIS — K080 Exfoliation of teeth due to systemic causes: Secondary | ICD-10-CM | POA: Diagnosis not present

## 2022-09-16 DIAGNOSIS — M170 Bilateral primary osteoarthritis of knee: Secondary | ICD-10-CM | POA: Diagnosis not present

## 2022-09-23 DIAGNOSIS — M170 Bilateral primary osteoarthritis of knee: Secondary | ICD-10-CM | POA: Diagnosis not present

## 2022-09-30 DIAGNOSIS — M170 Bilateral primary osteoarthritis of knee: Secondary | ICD-10-CM | POA: Diagnosis not present

## 2022-10-22 DIAGNOSIS — I10 Essential (primary) hypertension: Secondary | ICD-10-CM | POA: Diagnosis not present

## 2022-10-22 DIAGNOSIS — C61 Malignant neoplasm of prostate: Secondary | ICD-10-CM | POA: Diagnosis not present

## 2022-10-22 DIAGNOSIS — N1831 Chronic kidney disease, stage 3a: Secondary | ICD-10-CM | POA: Diagnosis not present

## 2022-10-22 DIAGNOSIS — N189 Chronic kidney disease, unspecified: Secondary | ICD-10-CM | POA: Diagnosis not present

## 2022-10-22 DIAGNOSIS — Z23 Encounter for immunization: Secondary | ICD-10-CM | POA: Diagnosis not present

## 2022-10-22 DIAGNOSIS — I129 Hypertensive chronic kidney disease with stage 1 through stage 4 chronic kidney disease, or unspecified chronic kidney disease: Secondary | ICD-10-CM | POA: Diagnosis not present

## 2022-10-22 DIAGNOSIS — K7689 Other specified diseases of liver: Secondary | ICD-10-CM | POA: Diagnosis not present

## 2022-10-29 ENCOUNTER — Encounter: Payer: Self-pay | Admitting: Gastroenterology

## 2022-10-29 ENCOUNTER — Ambulatory Visit: Payer: Medicare Other | Admitting: Gastroenterology

## 2022-10-29 ENCOUNTER — Other Ambulatory Visit (INDEPENDENT_AMBULATORY_CARE_PROVIDER_SITE_OTHER): Payer: Medicare Other

## 2022-10-29 VITALS — BP 122/68 | HR 40 | Ht 68.0 in | Wt 180.0 lb

## 2022-10-29 DIAGNOSIS — K7689 Other specified diseases of liver: Secondary | ICD-10-CM

## 2022-10-29 DIAGNOSIS — R143 Flatulence: Secondary | ICD-10-CM | POA: Diagnosis not present

## 2022-10-29 DIAGNOSIS — R1013 Epigastric pain: Secondary | ICD-10-CM | POA: Diagnosis not present

## 2022-10-29 DIAGNOSIS — R634 Abnormal weight loss: Secondary | ICD-10-CM | POA: Diagnosis not present

## 2022-10-29 DIAGNOSIS — K862 Cyst of pancreas: Secondary | ICD-10-CM

## 2022-10-29 DIAGNOSIS — K5900 Constipation, unspecified: Secondary | ICD-10-CM | POA: Diagnosis not present

## 2022-10-29 LAB — CBC WITH DIFFERENTIAL/PLATELET
Basophils Absolute: 0.2 10*3/uL — ABNORMAL HIGH (ref 0.0–0.1)
Basophils Relative: 2.2 % (ref 0.0–3.0)
Eosinophils Absolute: 0.3 10*3/uL (ref 0.0–0.7)
Eosinophils Relative: 4.3 % (ref 0.0–5.0)
HCT: 48.7 % (ref 39.0–52.0)
Hemoglobin: 16.3 g/dL (ref 13.0–17.0)
Lymphocytes Relative: 18.3 % (ref 12.0–46.0)
Lymphs Abs: 1.3 10*3/uL (ref 0.7–4.0)
MCHC: 33.6 g/dL (ref 30.0–36.0)
MCV: 91.5 fl (ref 78.0–100.0)
Monocytes Absolute: 1 10*3/uL (ref 0.1–1.0)
Monocytes Relative: 13.8 % — ABNORMAL HIGH (ref 3.0–12.0)
Neutro Abs: 4.4 10*3/uL (ref 1.4–7.7)
Neutrophils Relative %: 61.4 % (ref 43.0–77.0)
Platelets: 238 10*3/uL (ref 150.0–400.0)
RBC: 5.32 Mil/uL (ref 4.22–5.81)
RDW: 13.1 % (ref 11.5–15.5)
WBC: 7.1 10*3/uL (ref 4.0–10.5)

## 2022-10-29 LAB — COMPREHENSIVE METABOLIC PANEL
ALT: 14 U/L (ref 0–53)
AST: 14 U/L (ref 0–37)
Albumin: 4.3 g/dL (ref 3.5–5.2)
Alkaline Phosphatase: 52 U/L (ref 39–117)
BUN: 30 mg/dL — ABNORMAL HIGH (ref 6–23)
CO2: 26 mEq/L (ref 19–32)
Calcium: 9.1 mg/dL (ref 8.4–10.5)
Chloride: 106 mEq/L (ref 96–112)
Creatinine, Ser: 2.22 mg/dL — ABNORMAL HIGH (ref 0.40–1.50)
GFR: 27.89 mL/min — ABNORMAL LOW (ref >60.00)
Glucose, Bld: 94 mg/dL (ref 70–99)
Potassium: 3.3 mEq/L — ABNORMAL LOW (ref 3.5–5.1)
Sodium: 143 mEq/L (ref 135–145)
Total Bilirubin: 1.1 mg/dL (ref 0.2–1.2)
Total Protein: 6.9 g/dL (ref 6.0–8.3)

## 2022-10-29 LAB — TSH: TSH: 1.12 u[IU]/mL (ref 0.35–5.50)

## 2022-10-29 MED ORDER — PANTOPRAZOLE SODIUM 40 MG PO TBEC: 40.0000 mg | DELAYED_RELEASE_TABLET | Freq: Every day | ORAL | 3 refills | Status: DC

## 2022-10-29 MED ORDER — MOTEGRITY 2 MG PO TABS: 1.0000 | ORAL_TABLET | Freq: Every day | ORAL | 3 refills | Status: DC

## 2022-10-29 MED ORDER — NA SULFATE-K SULFATE-MG SULF 17.5-3.13-1.6 GM/177ML PO SOLN
1.0000 | Freq: Once | ORAL | 0 refills | Status: AC
Start: 2022-10-29 — End: 2022-10-29

## 2022-10-29 NOTE — Patient Instructions (Addendum)
We have sent the following medications to your pharmacy for you to pick up at your convenience: Motegrity, Protonix  Your provider has requested that you go to the basement level for lab work before leaving today. Press "B" on the elevator. The lab is located at the first door on the left as you exit the elevator.  You will be contacted by [facility] in the next 2 days to arrange an MRI.  The number on your caller ID will be [phone], please answer when they call.  If you have not heard from them in 2 days please call [phone] to schedule.    You have been scheduled for an endoscopy and colonoscopy. Please follow the written instructions given to you at your visit today.  Please pick up your prep supplies at the pharmacy within the next 1-3 days.  If you use inhalers (even only as needed), please bring them with you on the day of your procedure.  DO NOT TAKE 7 DAYS PRIOR TO TEST- Trulicity (dulaglutide) Ozempic, Wegovy (semaglutide) Mounjaro (tirzepatide) Bydureon Bcise (exanatide extended release)  DO NOT TAKE 1 DAY PRIOR TO YOUR TEST Rybelsus (semaglutide) Adlyxin (lixisenatide) Victoza (liraglutide) Byetta (exanatide) ___________________________________________________________________________ _______________________________________________________  If your blood pressure at your visit was 140/90 or greater, please contact your primary care physician to follow up on this.  _______________________________________________________  If you are age 48 or older, your body mass index should be between 23-30. Your Body mass index is 27.37 kg/m. If this is out of the aforementioned range listed, please consider follow up with your Primary Care Provider.  If you are age 5 or younger, your body mass index should be between 19-25. Your Body mass index is 27.37 kg/m. If this is out of the aformentioned range listed, please consider follow up with your Primary Care  Provider.   ________________________________________________________  The [place] [facility] providers would like to encourage you to use [facility] to communicate with providers for non-urgent requests or questions.  Due to long hold times on the telephone, sending your provider a message by [facility] may be a faster and more efficient way to get a response.  Please allow 48 business hours for a response.  Please remember that this is for non-urgent requests.  _______________________________________________________

## 2022-10-29 NOTE — Progress Notes (Signed)
Chief Complaint: weight loss, constipation, epigastric pain Primary GI MD: Dr. Rhea Belton  HPI: 77 year old male history of chronic constipation, adenomatous polyps, chronic GERD, small bowel obstruction s/p lysis of adhesions in 2014, presents for weight loss, constipation, and epigastric pain.  S/p appendectomy, prostatectomy, and bowel obstruction in 2014 for which he underwent lysis of adhesions.  Last seen in 2022 by Amy Esterwood PA-C  At that time he recently had MRI of abdomen to reassess multiple hepatic cysts and scattered cystic lesions in the uncinate process of the pancreas measuring about 0.8 cm most consistent with sidebranch IPMN.  MRI showed stable lesions and recommended repeat in 2 years (2024).  In 2022, Patient was having symptomatic chronic constipation and gassiness with associated abdominal discomfort particularly at nighttime.  MiraLAX was recommended.  He has struggled with Amitiza and Linzess in the past as they give him diarrhea.  TODAY - Patient reports continued constipation.  He tried MiraLAX for a few weeks with no relief.  The only thing that works for him is taking Dulcolax and he feels that no longer works.  He has possibly 2 bowel movements per week only if he takes a Dulcolax.  If he does not take any type of laxative he will not have a bowel movement.  Bowel movements are hard/pebbles.  Denies melena/hematochezia.  Patient also reports 20 pound unintentional weight loss over the last year.  He notes of epigastric pain with eating.  He notes it to be worse at nighttime after he eats something heavy like a steak before bed.  He has had EGD in the past for this but he feels this epigastric pain is different than it was back then.  It makes him not want to eat sometimes.  Reports occasional burning in his chest.  Denies dysphagia.  Social alcohol use.  Denies NSAIDs.  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 Polyps were consistent with 1 tubular adenoma, 2 sessile serrated polyps and 1 hyperplastic polyp. Indicated for 5-year interval follow-up   EGD 12/2018 for epigastric pain - Normal esophagus - Normal stomach - Erythematous duodenopathy - Normal second portion of duodenum - Mild chronic gastritis negative for H. 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  Past Medical History:  Diagnosis Date   Allergic rhinitis    Allergy    Cataract    Chronic kidney disease    Colon polyps    Dactylitis    GERD (gastroesophageal reflux disease)    Hemorrhoids    Hx of basal cell carcinoma    Hx of herpes genitalis    Hypercholesterolemia    Hypertension    Internal hemorrhoids    Prostate cancer (HCC)    followed by [facility]   Tubular adenoma of colon     Past Surgical History:  Procedure Laterality Date   APPENDECTOMY     C-spine surgery W/ fusion of cervical discs  2003   CATARACT EXTRACTION Left    colonoscopy     compound fracture of left leg     macular degeneration eye surgery Left    PROSTATE SURGERY     SMALL INTESTINE SURGERY     blockage  Current Outpatient Medications  Medication Sig Dispense Refill   amLODipine (NORVASC) 10 MG tablet Take 10 mg by mouth daily.     aspirin 81 MG tablet Take 81 mg by mouth daily.     atorvastatin (LIPITOR) 20 MG tablet Take 20 mg by mouth daily.     clonazePAM (KLONOPIN) 0.5 MG tablet Take 0.5 mg by mouth at bedtime.     hydrochlorothiazide (HYDRODIURIL) 12.5 MG tablet Take 12.5 mg by mouth every morning.     loratadine (CLARITIN) 10 MG tablet Take 10 mg by mouth daily.     losartan (COZAAR) 100 MG tablet Take 100 mg by mouth daily.     omeprazole (PRILOSEC) 20 MG  capsule Take 1 capsule (20 mg total) by mouth daily before breakfast. 90 capsule 2   Potassium 99 MG TABS Take 99 mg by mouth daily.     No current facility-administered medications for this visit.    Allergies as of 10/29/2022   (No Known Allergies)    Family History  Problem Relation Age of Onset   Colon cancer Mother        dx'd age 53's   Hypertension Mother    CVA Father    Hypertension Father    Heart attack Brother    Diabetes Brother    Hypertension Brother    Colon cancer Sister 32       passed away     Social History   Socioeconomic History   Marital status: Divorced    Spouse name: Not on file   Number of children: 2   Years of education: Not on file   Highest education level: Not on file  Occupational History   Occupation: Retired    Associate Professor: [employer]  Tobacco Use   Smoking status: Former    Types: Cigarettes   Smokeless tobacco: Never  Vaping Use   Vaping status: Never Used  Substance and Sexual Activity   Alcohol use: Yes    Alcohol/week: 0.0 standard drinks of alcohol    Comment: socially   Drug use: No   Sexual activity: Not on file  Other Topics Concern   Not on file  Social History Narrative   Not on file   Social Determinants of Health   Financial Resource Strain: Not on file  Food Insecurity: Not on file  Transportation Needs: Not on file  Physical Activity: Not on file  Stress: Not on file  Social Connections: Not on file  Intimate Partner Violence: Not on file    Review of Systems:    Constitutional: No weight loss, fever, chills, weakness or fatigue HEENT: Eyes: No change in vision               Ears, Nose, Throat:  No change in hearing or congestion Skin: No rash or itching Cardiovascular: No chest pain, chest pressure or palpitations   Respiratory: No SOB or cough Gastrointestinal: See HPI and otherwise negative Genitourinary: No dysuria or change in urinary frequency Neurological: No headache, dizziness or  syncope Musculoskeletal: No new muscle or joint pain Hematologic: No bleeding or bruising Psychiatric: No history of depression or anxiety    Physical Exam:  Vital signs: Ht 5\' 8"  (1.727 m)   Wt 81.6 kg   BMI 27.37 kg/m   Constitutional: NAD, Well developed, Well nourished, alert and cooperative.  Appears significantly younger than stated age Head:  Normocephalic and atraumatic. Eyes:   PEERL, EOMI. No icterus. Conjunctiva pink. Respiratory: Respirations even and unlabored. Lungs  clear to auscultation bilaterally.   No wheezes, crackles, or rhonchi.  Cardiovascular:  Regular rate and rhythm. No peripheral edema, cyanosis or pallor.  Gastrointestinal:  Soft, nondistended, nontender. No rebound or guarding. Normal bowel sounds. No appreciable masses or hepatomegaly. Rectal:  Not performed.  Msk:  Symmetrical without gross deformities. Without edema, no deformity or joint abnormality.  Neurologic:  Alert and  oriented x4;  grossly normal neurologically.  Skin:   Dry and intact without significant lesions or rashes. Psychiatric: Oriented to person, place and time. Demonstrates good judgement and reason without abnormal affect or behaviors.   RELEVANT LABS AND IMAGING: CBC No results found for: "WBC", "RBC", "HGB", "HCT", "PLT", "MCV", "MCH", "MCHC", "RDW", "LYMPHSABS", "MONOABS", "EOSABS", "BASOSABS"  CMP     Component Value Date/Time   NA 135 11/29/2018 1224   K 4.0 11/29/2018 1224   CL 100 11/29/2018 1224   CO2 25 11/29/2018 1224   GLUCOSE 106 (H) 11/29/2018 1224   BUN 36 (H) 11/29/2018 1224   CREATININE 2.41 (H) 11/29/2018 1224   CALCIUM 9.3 11/29/2018 1224   PROT 7.5 11/29/2018 1224   ALBUMIN 4.7 11/29/2018 1224   AST 19 11/29/2018 1224   ALT 21 11/29/2018 1224   ALKPHOS 59 11/29/2018 1224   BILITOT 1.0 11/29/2018 1224     Assessment/Plan:   77 year old male history of chronic constipation, adenomatous polyps, chronic GERD, small bowel obstruction s/p lysis of  adhesions in 2014 who had underwent EGD/Colonoscopy in 2020 for epigastric pain and constipation without significant findings presents with worsening symptoms.  Chronic constipation Has failed MiraLAX, Amitiza, Linzess, fiber, and Dulcolax. - CBC, CMP, TSH - Trial of Motegrity - He is due for his colonoscopy next year, but is having significantly worsening symptoms with associated weight loss.  I think it is reasonable to do his colonoscopy early in the presence of the symptoms - Colonoscopy for further evaluation  GERD Epigastric pain Weight loss Persistent epigastric pain that feels different from it did in 2020 and not on an antacid with some mild GERD and weight loss secondary to decreased caloric intake -EGD for further evaluation - Protonix 40 Mg once daily - Educated patient on lifestyle modifications and provided patient education handouts.  Recommend not eating 2 to 3 hours prior to bedtime to avoid nocturnal symptoms  Hepatic and pancreatic cysts -Due for surveillance MRI abdomen with without contrast (2024)  [facility] 10/29/2022, 8:24 AM  Cc: Tally Joe, MD

## 2022-10-30 ENCOUNTER — Other Ambulatory Visit: Payer: Self-pay | Admitting: *Deleted

## 2022-10-30 DIAGNOSIS — E876 Hypokalemia: Secondary | ICD-10-CM

## 2022-10-30 MED ORDER — POTASSIUM CHLORIDE CRYS ER 20 MEQ PO TBCR: 20.0000 meq | EXTENDED_RELEASE_TABLET | Freq: Every day | ORAL | 0 refills | Status: DC

## 2022-10-30 NOTE — Addendum Note (Signed)
Addended by: Richardson Chiquito on: 10/30/2022 04:25 PM   Modules accepted: Orders

## 2022-11-02 NOTE — Progress Notes (Signed)
Addendum: Reviewed and agree with assessment and management plan. Kadijah Shamoon M, MD  

## 2022-11-05 ENCOUNTER — Ambulatory Visit (HOSPITAL_COMMUNITY): Payer: Medicare Other

## 2022-11-06 ENCOUNTER — Ambulatory Visit (HOSPITAL_COMMUNITY)
Admission: RE | Admit: 2022-11-06 | Discharge: 2022-11-06 | Disposition: A | Discharge status: Home / Self Care | Payer: Medicare Other | Source: Ambulatory Visit | Attending: Gastroenterology | Admitting: Gastroenterology

## 2022-11-06 ENCOUNTER — Other Ambulatory Visit: Payer: Self-pay | Admitting: Gastroenterology

## 2022-11-06 DIAGNOSIS — K5900 Constipation, unspecified: Secondary | ICD-10-CM | POA: Diagnosis not present

## 2022-11-06 DIAGNOSIS — R634 Abnormal weight loss: Secondary | ICD-10-CM

## 2022-11-06 DIAGNOSIS — R143 Flatulence: Secondary | ICD-10-CM | POA: Diagnosis not present

## 2022-11-06 DIAGNOSIS — I700 Atherosclerosis of aorta: Secondary | ICD-10-CM | POA: Diagnosis not present

## 2022-11-06 DIAGNOSIS — K862 Cyst of pancreas: Secondary | ICD-10-CM | POA: Insufficient documentation

## 2022-11-06 DIAGNOSIS — R1013 Epigastric pain: Secondary | ICD-10-CM

## 2022-11-06 DIAGNOSIS — K7689 Other specified diseases of liver: Secondary | ICD-10-CM | POA: Diagnosis not present

## 2022-11-06 MED ORDER — GADOBUTROL 1 MMOL/ML IV SOLN
8.0000 mL | Freq: Once | INTRAVENOUS | Status: AC | PRN
  Administered 2022-11-06: 8 mL via INTRAVENOUS

## 2022-11-16 ENCOUNTER — Other Ambulatory Visit (INDEPENDENT_AMBULATORY_CARE_PROVIDER_SITE_OTHER): Payer: Medicare Other

## 2022-11-16 DIAGNOSIS — E876 Hypokalemia: Secondary | ICD-10-CM | POA: Diagnosis not present

## 2022-11-16 LAB — COMPREHENSIVE METABOLIC PANEL
ALT: 13 U/L (ref 0–53)
AST: 15 U/L (ref 0–37)
Albumin: 4.2 g/dL (ref 3.5–5.2)
Alkaline Phosphatase: 46 U/L (ref 39–117)
BUN: 25 mg/dL — ABNORMAL HIGH (ref 6–23)
CO2: 26 meq/L (ref 19–32)
Calcium: 9 mg/dL (ref 8.4–10.5)
Chloride: 105 meq/L (ref 96–112)
Creatinine, Ser: 1.91 mg/dL — ABNORMAL HIGH (ref 0.40–1.50)
GFR: 33.4 mL/min — ABNORMAL LOW (ref >60.00)
Glucose, Bld: 131 mg/dL — ABNORMAL HIGH (ref 70–99)
Potassium: 4 meq/L (ref 3.5–5.1)
Sodium: 140 meq/L (ref 135–145)
Total Bilirubin: 1.2 mg/dL (ref 0.2–1.2)
Total Protein: 6.6 g/dL (ref 6.0–8.3)

## 2022-11-23 DIAGNOSIS — N1832 Chronic kidney disease, stage 3b: Secondary | ICD-10-CM | POA: Diagnosis not present

## 2022-11-23 DIAGNOSIS — I10 Essential (primary) hypertension: Secondary | ICD-10-CM | POA: Diagnosis not present

## 2022-11-23 DIAGNOSIS — Z1331 Encounter for screening for depression: Secondary | ICD-10-CM | POA: Diagnosis not present

## 2022-11-23 DIAGNOSIS — E782 Mixed hyperlipidemia: Secondary | ICD-10-CM | POA: Diagnosis not present

## 2022-11-23 DIAGNOSIS — K5900 Constipation, unspecified: Secondary | ICD-10-CM | POA: Diagnosis not present

## 2022-11-23 DIAGNOSIS — Z23 Encounter for immunization: Secondary | ICD-10-CM | POA: Diagnosis not present

## 2022-11-23 DIAGNOSIS — Z0000 Encounter for general adult medical examination without abnormal findings: Secondary | ICD-10-CM | POA: Diagnosis not present

## 2022-11-24 ENCOUNTER — Telehealth: Payer: Self-pay | Admitting: Gastroenterology

## 2022-11-24 NOTE — Telephone Encounter (Signed)
Inbound call from [facility] stating they need more clinical information regarding patient's medication. States forms will be sent over with the information that is needed. Stated information can be faxed to [phone] and a call back number is [phone]. States they need information back 11 days from today 11/14, other wise they will issue a denial for medication. Please advise, thank you.

## 2022-11-24 NOTE — Telephone Encounter (Signed)
Inbound call from patient stating he spoke with insurance regarding Motegrity. States insurance is working on sending over paperwork to be completed so patient is able to receive medication. Please advise, thank you.

## 2022-11-26 ENCOUNTER — Other Ambulatory Visit: Payer: Self-pay | Admitting: Gastroenterology

## 2022-11-30 ENCOUNTER — Telehealth: Payer: Self-pay | Admitting: Pharmacy Technician

## 2022-11-30 NOTE — Telephone Encounter (Signed)
Pharmacy Patient Advocate Encounter   Received notification from CoverMyMeds that prior authorization for MOTEGRITY 2MG  is required/requested.   Insurance verification completed.   The patient is insured through [facility] .   Per test claim: PA required; PA submitted to [facility] via CoverMyMeds Key/confirmation #/EOC [id number] Status is pending

## 2022-12-04 ENCOUNTER — Ambulatory Visit: Payer: Medicare Other | Admitting: Internal Medicine

## 2022-12-07 ENCOUNTER — Other Ambulatory Visit (HOSPITAL_COMMUNITY): Payer: Self-pay

## 2022-12-07 NOTE — Telephone Encounter (Signed)
Received request for additional information. Answered questions on form and faxed back to number listed on form [phone].

## 2022-12-10 ENCOUNTER — Other Ambulatory Visit (HOSPITAL_COMMUNITY): Payer: Self-pay

## 2022-12-10 NOTE — Telephone Encounter (Signed)
This medication has been filled on 10.22.24. Will check to see if approval letter was faxed in. If not will need to call insurance to update approval dates for this encounter.

## 2022-12-15 ENCOUNTER — Telehealth: Payer: Self-pay | Admitting: Gastroenterology

## 2022-12-15 DIAGNOSIS — R1013 Epigastric pain: Secondary | ICD-10-CM

## 2022-12-15 DIAGNOSIS — K5900 Constipation, unspecified: Secondary | ICD-10-CM

## 2022-12-15 NOTE — Telephone Encounter (Signed)
Inbound call from patient stating that he is having constant pain and is needing to speak with nurse as soon as possible. Please advise.

## 2022-12-15 NOTE — Telephone Encounter (Signed)
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

## 2022-12-15 NOTE — Telephone Encounter (Signed)
I have spoken to patient to advise we would like him to have labwork completed for some additional evaluation of abdominal pain. Patient states that he will come for labs tomorrow. In addition, I have offered to reschedule his endoscopy/colonoscopy to a sooner date with a different provider. However, patient states that he would like to make changes discussed earlier and have labs prior to deciding on moving his procedure dates.

## 2022-12-15 NOTE — Telephone Encounter (Signed)
Patient calls with complaints of continued abdominal pain (states this has been occurring for years, but symptoms are now progressively worsening). Pain is somewhat "burning, achy." Starts in the epigastrium and radiates down throughout the remainder of the abdomen. Patient says this pain is constant, although worse after eating/drinking fluids or with lying down. Also notes that he has been unable to sleep due to pain and when he does go to sleep, the pain awakens him. He has occasional nausea, no vomiting. When asked, patient states that he is chronically constipated despite new rx of motegrity 2 mg daily (he has failed Miralax, amitiza, Linzess, fiber and dulcolax previously), having only 1-2 bowel movements per week. No melena or hematochezia noted.  We have discussed that patient is currently scheduled for endoscopy/colonoscopy for further evaluation of his symptoms 01/2023. Unfortunately, I do not currently have any sooner openings at this time.  In the meantime, I have suggested he increase his pantoprazole to 40 mg twice daily, 30 minutes before eating; we have discussed antireflux measures, not laying down within 2-3 hours of eating. No eating 2-3 hours before bed.  We also discussed continuing motegrity 2 mg daily, adding Miralax 1-2 capfuls daily to his regimen and adding in benefiber 1 teaspoon daily, working his way up. Increase fluid intake.   Patient was advised that should he begin having severe abdominal pain not relieved by the above measures, he should be seen in ER for more emergent evaluation.  Dr Pyrtle/Bayley, do you have any additional suggestions/recommendations until procedure?

## 2022-12-16 ENCOUNTER — Other Ambulatory Visit (INDEPENDENT_AMBULATORY_CARE_PROVIDER_SITE_OTHER): Payer: Medicare Other

## 2022-12-16 DIAGNOSIS — R1013 Epigastric pain: Secondary | ICD-10-CM

## 2022-12-16 DIAGNOSIS — K5900 Constipation, unspecified: Secondary | ICD-10-CM

## 2022-12-16 LAB — IBC + FERRITIN
Ferritin: 256.9 ng/mL (ref 22.0–322.0)
Iron: 88 ug/dL (ref 42–165)
Saturation Ratios: 33.1 % (ref 20.0–50.0)
TIBC: 266 ug/dL (ref 250.0–450.0)
Transferrin: 190 mg/dL — ABNORMAL LOW (ref 212.0–360.0)

## 2022-12-16 LAB — AMYLASE: Amylase: 86 U/L (ref 27–131)

## 2022-12-16 LAB — LIPASE: Lipase: 49 U/L (ref 11.0–59.0)

## 2022-12-17 NOTE — Telephone Encounter (Signed)
Left message advising patient that he may take a dulcolax tablet if needed.

## 2022-12-17 NOTE — Telephone Encounter (Signed)
Inbound call from patient stating he has been following instructions for medications. Patient states he has only had one small bowel movement this morning. Patient is requesting to know if he is able to take a ducolax as well. Please advise, thank you.

## 2022-12-20 ENCOUNTER — Emergency Department (HOSPITAL_COMMUNITY): Payer: Medicare Other

## 2022-12-20 ENCOUNTER — Emergency Department (HOSPITAL_COMMUNITY)
Admission: EM | Admit: 2022-12-20 | Discharge: 2022-12-21 | Disposition: A | Discharge status: Home / Self Care | Payer: Medicare Other | Attending: Emergency Medicine | Admitting: Emergency Medicine

## 2022-12-20 ENCOUNTER — Other Ambulatory Visit: Payer: Self-pay

## 2022-12-20 DIAGNOSIS — Z7982 Long term (current) use of aspirin: Secondary | ICD-10-CM | POA: Insufficient documentation

## 2022-12-20 DIAGNOSIS — K7689 Other specified diseases of liver: Secondary | ICD-10-CM | POA: Diagnosis not present

## 2022-12-20 DIAGNOSIS — R748 Abnormal levels of other serum enzymes: Secondary | ICD-10-CM | POA: Insufficient documentation

## 2022-12-20 DIAGNOSIS — R1013 Epigastric pain: Secondary | ICD-10-CM | POA: Diagnosis not present

## 2022-12-20 DIAGNOSIS — I700 Atherosclerosis of aorta: Secondary | ICD-10-CM | POA: Diagnosis not present

## 2022-12-20 LAB — COMPREHENSIVE METABOLIC PANEL
ALT: 24 U/L (ref 0–44)
AST: 24 U/L (ref 15–41)
Albumin: 4 g/dL (ref 3.5–5.0)
Alkaline Phosphatase: 45 U/L (ref 38–126)
Anion gap: 11 (ref 5–15)
BUN: 34 mg/dL — ABNORMAL HIGH (ref 8–23)
CO2: 22 mmol/L (ref 22–32)
Calcium: 9.2 mg/dL (ref 8.9–10.3)
Chloride: 105 mmol/L (ref 98–111)
Creatinine, Ser: 1.95 mg/dL — ABNORMAL HIGH (ref 0.61–1.24)
GFR, Estimated: 35 mL/min — ABNORMAL LOW (ref >60)
Glucose, Bld: 97 mg/dL (ref 70–99)
Potassium: 4.1 mmol/L (ref 3.5–5.1)
Sodium: 138 mmol/L (ref 135–145)
Total Bilirubin: 1.1 mg/dL (ref <1.2)
Total Protein: 6.7 g/dL (ref 6.5–8.1)

## 2022-12-20 LAB — CBC
HCT: 46.2 % (ref 39.0–52.0)
Hemoglobin: 15.5 g/dL (ref 13.0–17.0)
MCH: 30.4 pg (ref 26.0–34.0)
MCHC: 33.5 g/dL (ref 30.0–36.0)
MCV: 90.6 fL (ref 80.0–100.0)
Platelets: 237 10*3/uL (ref 150–400)
RBC: 5.1 MIL/uL (ref 4.22–5.81)
RDW: 12.1 % (ref 11.5–15.5)
WBC: 7.8 10*3/uL (ref 4.0–10.5)
nRBC: 0 % (ref 0.0–0.2)

## 2022-12-20 LAB — LIPASE, BLOOD: Lipase: 68 U/L — ABNORMAL HIGH (ref 11–51)

## 2022-12-20 NOTE — ED Triage Notes (Signed)
Pt arrives to ED c/o epigastric pain x several weeks. Pt reports that pain happens when eating and laying down. Pt denies N/V and SOB. Pt reports that gastrologist gave pt multiple medications for reflux, but pt states that medications are not working.

## 2022-12-21 ENCOUNTER — Emergency Department (HOSPITAL_COMMUNITY): Payer: Medicare Other

## 2022-12-21 DIAGNOSIS — K7689 Other specified diseases of liver: Secondary | ICD-10-CM | POA: Diagnosis not present

## 2022-12-21 DIAGNOSIS — I700 Atherosclerosis of aorta: Secondary | ICD-10-CM | POA: Diagnosis not present

## 2022-12-21 DIAGNOSIS — R1013 Epigastric pain: Secondary | ICD-10-CM | POA: Diagnosis not present

## 2022-12-21 LAB — TROPONIN I (HIGH SENSITIVITY)
Troponin I (High Sensitivity): 11 ng/L (ref <18)
Troponin I (High Sensitivity): 12 ng/L (ref <18)

## 2022-12-21 LAB — URINALYSIS, ROUTINE W REFLEX MICROSCOPIC
Bilirubin Urine: NEGATIVE
Glucose, UA: NEGATIVE mg/dL
Hgb urine dipstick: NEGATIVE
Ketones, ur: NEGATIVE mg/dL
Leukocytes,Ua: NEGATIVE
Nitrite: NEGATIVE
Protein, ur: NEGATIVE mg/dL
Specific Gravity, Urine: 1.008 (ref 1.005–1.030)
pH: 6 (ref 5.0–8.0)

## 2022-12-21 LAB — LACTIC ACID, PLASMA: Lactic Acid, Venous: 0.9 mmol/L (ref 0.5–1.9)

## 2022-12-21 MED ORDER — SUCRALFATE 1 G PO TABS: 1.0000 g | ORAL_TABLET | Freq: Three times a day (TID) | ORAL | 0 refills | Status: DC

## 2022-12-21 MED ORDER — ONDANSETRON HCL 4 MG/2ML IJ SOLN
4.0000 mg | Freq: Once | INTRAMUSCULAR | Status: AC
  Administered 2022-12-21: 4 mg via INTRAVENOUS
  Filled 2022-12-21: qty 2

## 2022-12-21 MED ORDER — LACTATED RINGERS IV BOLUS
1000.0000 mL | Freq: Once | INTRAVENOUS | Status: AC
  Administered 2022-12-21: 1000 mL via INTRAVENOUS

## 2022-12-21 MED ORDER — PANTOPRAZOLE SODIUM 40 MG IV SOLR
40.0000 mg | Freq: Once | INTRAVENOUS | Status: AC
  Administered 2022-12-21: 40 mg via INTRAVENOUS
  Filled 2022-12-21: qty 10

## 2022-12-21 MED ORDER — IOHEXOL 350 MG/ML SOLN
100.0000 mL | Freq: Once | INTRAVENOUS | Status: AC | PRN
  Administered 2022-12-21: 100 mL via INTRAVENOUS

## 2022-12-21 NOTE — Discharge Instructions (Addendum)
Your testing is reassuring.  Continue protonix as prescribed.  Avoid alcohol, caffeine, NSAID medications, spicy foods. Follow-up with your gastroenterologist for EGD as scheduled.  Return to the ED with worsening pain, nausea, vomiting, unable to eat or drink, chest pain, shortness of breath or other concerns.

## 2022-12-21 NOTE — ED Notes (Signed)
Pt ambulated to the restroom with no apparent distress, gait stable.

## 2022-12-21 NOTE — ED Provider Notes (Signed)
[place] [facility] AT [facility] Provider Note   CSN: [id number] Arrival date & time: 12/20/22  2046     History  Chief Complaint  Patient presents with   Abdominal Pain    Kurt Hall is a 77 y.o. male.  Patient here with wife.  He reports several months of ongoing epigastric pain for which she follows with gastroenterology.  He was told it was possibly GERD.  He is taking pantoprazole without relief.  Reports he has pain all the time but it becomes worse with eating and worse with lying down at night.  He is here today because his wife is concerned that the pain is not improving.  He states the pain is worse with eating worse with lying down at night.  No nausea or vomiting.  No black or bloody stools.  Does have a history of chronic constipation and has to take Colace.  He is being scheduled for an EGD by his gastroenterologist in December.  Wife is concerned because he is having pain with eating and having some weight loss.  He had an MRI of his abdomen to follow some pancreatic cysts but has never had a CT scan.  He has had multiple surgeries in the past including appendectomy and lysis of adhesions. He denies any exertional chest pain, pain with breathing, cough, fever, nausea, vomiting, black or bloody stools, pain with urination or blood in the urine.  The history is provided by the patient.  Abdominal Pain Associated symptoms: fatigue   Associated symptoms: no chest pain, no cough, no dysuria, no fever, no hematuria, no nausea, no shortness of breath and no vomiting        Home Medications Prior to Admission medications   Medication Sig Start Date End Date Taking? Authorizing Provider  amLODipine (NORVASC) 10 MG tablet Take 10 mg by mouth daily.    [provider]  aspirin 81 MG tablet Take 81 mg by mouth daily.    [provider]  atorvastatin (LIPITOR) 20 MG tablet Take 20 mg by mouth daily.    [provider]   clonazePAM (KLONOPIN) 0.5 MG tablet Take 0.5 mg by mouth at bedtime.    [provider]  hydrochlorothiazide (HYDRODIURIL) 12.5 MG tablet Take 12.5 mg by mouth every morning. 10/24/20   [provider]  loratadine (CLARITIN) 10 MG tablet Take 10 mg by mouth daily. 06/27/19   [provider]  losartan (COZAAR) 100 MG tablet Take 100 mg by mouth daily. Patient not taking: Reported on 10/29/2022    [provider]  omeprazole (PRILOSEC) 20 MG capsule Take 1 capsule (20 mg total) by mouth daily before breakfast. Patient not taking: Reported on 10/29/2022 01/27/21   Esterwood, Amy S, PA-C  pantoprazole (PROTONIX) 40 MG tablet Take 1 tablet (40 mg total) by mouth daily. 10/29/22   Legrand Como, PA-C  Potassium 99 MG TABS Take 99 mg by mouth daily.    [provider]  potassium chloride SA (KLOR-CON M) 20 MEQ tablet Take 1 tablet (20 mEq total) by mouth daily. Have repeat labs 11/13/22 10/30/22   Legrand Como, PA-C  Prucalopride Succinate (MOTEGRITY) 2 MG TABS Take 1 tablet (2 mg total) by mouth daily. 10/29/22   Legrand Como, PA-C      Allergies    Patient has no known allergies.    Review of Systems   Review of Systems  Constitutional:  Positive for activity change, appetite change and fatigue.  Negative for fever.  HENT:  Negative for congestion and rhinorrhea.   Respiratory:  Negative for cough, chest tightness and shortness of breath.   Cardiovascular:  Negative for chest pain.  Gastrointestinal:  Positive for abdominal pain. Negative for nausea and vomiting.  Genitourinary:  Negative for dysuria and hematuria.  Musculoskeletal:  Negative for arthralgias and myalgias.  Skin:  Negative for rash.  Neurological:  Negative for dizziness, weakness and headaches.   all other systems are negative except as noted in the HPI and PMH.    Physical Exam Updated Vital Signs BP (!) 154/87 (BP Location: Right Arm)   Pulse 78   Temp 97.9 F  (36.6 C) (Oral)   Resp 16   Ht 5\' 8"  (1.727 m)   Wt 81.6 kg   SpO2 100%   BMI 27.37 kg/m  Physical Exam Vitals and nursing note reviewed.  Constitutional:      General: He is not in acute distress.    Appearance: He is well-developed.  HENT:     Head: Normocephalic and atraumatic.     Mouth/Throat:     Pharynx: No oropharyngeal exudate.  Eyes:     Conjunctiva/sclera: Conjunctivae normal.     Pupils: Pupils are equal, round, and reactive to light.  Neck:     Comments: No meningismus. Cardiovascular:     Rate and Rhythm: Normal rate and regular rhythm.     Heart sounds: Normal heart sounds. No murmur heard. Pulmonary:     Effort: Pulmonary effort is normal. No respiratory distress.     Breath sounds: Normal breath sounds.  Abdominal:     Palpations: Abdomen is soft.     Tenderness: There is abdominal tenderness. There is no guarding or rebound.     Comments: Epigastric tenderness, no guarding or rebound  Musculoskeletal:        General: No tenderness. Normal range of motion.     Cervical back: Normal range of motion and neck supple.     Comments: Intact DP and PT pulses bilaterally 5/5 strength in lower extremities  Skin:    General: Skin is warm.  Neurological:     Mental Status: He is alert and oriented to person, place, and time.     Cranial Nerves: No cranial nerve deficit.     Motor: No abnormal muscle tone.     Coordination: Coordination normal.     Comments:  5/5 strength throughout. CN 2-12 intact.Equal grip strength.   Psychiatric:        Behavior: Behavior normal.     ED Results / Procedures / Treatments   Labs (all labs ordered are listed, but only abnormal results are displayed) Labs Reviewed  LIPASE, BLOOD - Abnormal; Notable for the following components:      Result Value   Lipase 68 (*)    All other components within normal limits  COMPREHENSIVE METABOLIC PANEL - Abnormal; Notable for the following components:   BUN 34 (*)    Creatinine, Ser  1.95 (*)    GFR, Estimated 35 (*)    All other components within normal limits  URINALYSIS, ROUTINE W REFLEX MICROSCOPIC - Abnormal; Notable for the following components:   Color, Urine STRAW (*)    All other components within normal limits  CBC  LACTIC ACID, PLASMA  LACTIC ACID, PLASMA  TROPONIN I (HIGH SENSITIVITY)  TROPONIN I (HIGH SENSITIVITY)    EKG EKG Interpretation Date/Time:  Sunday December 20 2022 20:47:47 EST Ventricular Rate:  74 PR Interval:  136 QRS Duration:  80 QT Interval:  340 QTC Calculation: 377 R Axis:   27  Text Interpretation: Normal sinus rhythm Normal ECG When compared with ECG of 13-Dec-2002 02:34, PREVIOUS ECG IS PRESENT No significant change was found Confirmed by Glynn Octave [phone]) on 12/21/2022 1:54:08 AM  Radiology CT Angio Abd/Pel W and/or Wo Contrast  Result Date: 12/21/2022 CLINICAL DATA:  Epigastric pain for several weeks. Pain happens when eating and lying down. Medications for reflux not working. EXAM: CTA ABDOMEN AND PELVIS WITHOUT AND WITH CONTRAST TECHNIQUE: Multidetector CT imaging of the abdomen and pelvis was performed using the standard protocol during bolus administration of intravenous contrast. Multiplanar reconstructed images and MIPs were obtained and reviewed to evaluate the vascular anatomy. RADIATION DOSE REDUCTION: This exam was performed according to the departmental dose-optimization program which includes automated exposure control, adjustment of the mA and/or kV according to patient size and/or use of iterative reconstruction technique. CONTRAST:  OMNIPAQUE IOHEXOL 350 MG/ML SOLN COMPARISON:  MRI abdomen 11/06/2022 and abdominal ultrasound 06/02/2016 FINDINGS: VASCULAR Scattered calcified atherosclerotic plaque without hemodynamically significant stenosis. No aortic aneurysm or dissection. The mesenteric and renal arteries are widely patent. Widely patent inflow and outflow arteries bilaterally. Review of the MIP  images confirms the above findings. NON-VASCULAR Lower chest: No acute abnormality. Hepatobiliary: Hepatic cysts. Unremarkable gallbladder and biliary tree. Pancreas: Unremarkable. Spleen: Unremarkable. Adrenals/Urinary Tract: Unremarkable adrenal glands. Bilateral cortical renal scarring. Multiple cystic lesions in both kidneys some of which are too small to characterize but likely represent benign cysts. No follow-up recommended. No ureteral calculi or hydronephrosis. Unremarkable bladder. Stomach/Bowel: Stomach is within normal limits. Ligament of Treitz is in the left upper quadrant. There is an unusual course of the proximal jejunum which crosses over midline with the majority of jejunum located in the right hemiabdomen. No bowel wall thickening or abnormal enhancement. No evidence of bowel obstruction. The appendix is not visualized. Lymphatic: No lymphadenopathy. Reproductive: No acute abnormality. Other: No free intraperitoneal fluid or air. Musculoskeletal: No acute fracture or destructive osseous lesion. IMPRESSION: 1. No acute abnormality in the abdomen or pelvis. 2. Unusual course of the proximal jejunum which crosses over midline with the majority of jejunum located in the right hemiabdomen. This may be normal variation however appears new since MRI abdomen 11/06/2022 and internal hernia is difficult to exclude. No evidence of bowel obstruction or ischemia. Aortic Atherosclerosis (ICD10-I70.0). Electronically Signed   By: Minerva Fester M.D.   On: 12/21/2022 04:05   DG Chest 2 View  Result Date: 12/20/2022 CLINICAL DATA:  Epigastric pain. EXAM: CHEST - 2 VIEW COMPARISON:  March 11, 2004 FINDINGS: The heart size and mediastinal contours are within normal limits. Both lungs are clear. A radiopaque fusion plate and screws are seen overlying the lower cervical spine. The visualized skeletal structures are unremarkable. IMPRESSION: No active cardiopulmonary disease. Electronically Signed   By:  Aram Candela M.D.   On: 12/20/2022 22:11     Procedures Procedures    Medications Ordered in ED Medications  pantoprazole (PROTONIX) injection 40 mg (has no administration in time range)  lactated ringers bolus 1,000 mL (has no administration in time range)  ondansetron (ZOFRAN) injection 4 mg (has no administration in time range)    ED Course/ Medical Decision Making/ A&P                                 Medical Decision Making Amount and/or  Complexity of Data Reviewed Independent Historian: spouse Labs: ordered. Decision-making details documented in ED Course. Radiology: ordered and independent interpretation performed. Decision-making details documented in ED Course. ECG/medicine tests: ordered and independent interpretation performed. Decision-making details documented in ED Course.  Risk Prescription drug management.   Acute on chronic epigastric pain being managed as GERD by his gastroenterologist.  Substantially worsening and causing weight loss and poor appetite.  Stable vital signs.  EKG shows no acute ischemia.  Abdomen soft without peritoneal signs.  Labs show normal LFTs.  Lipase minimally elevated at 68.  Creatinine 1.9 which is near his baseline.  GFR is 35.  Patient did have MRI of his abdomen in September that was unrevealing.  Does show pancreatic cyst which she is aware of.  Risks and benefits of IV contrast to assess for mesenteric ischemia possibility discussed with patient and he agrees to proceed with IV hydration.  Lactate is normal. CTA is negative for evidence of mesenteric ischemia. Does show unusual course of jejunum which is new since MRI in September.  Radiology with concern for normal variant versus possible internal hernia.  Discussed with Dr. Hillery Hunter of general surgery who reviewed CT images.  He agrees there is no evidence of bowel obstruction and low suspicion for internal hernia at this time.  Recommends oral hydration and follow-up with  GI as scheduled.  Troponin negative with ongoing pain for several months.  Low suspicion for ACS.  On recheck, patient feels improved.  Tolerating p.o.  CT results discussed with him and his wife at bedside.  They have follow-up with GI this week.  Will continue PPI.  Add Carafate.  Avoid alcohol, caffeine, NSAID medications, spicy foods. Return precautions discussed.        Final Clinical Impression(s) / ED Diagnoses Final diagnoses:  Epigastric pain    Rx / DC Orders ED Discharge Orders     None         Alvis Edgell, Jeannett Senior, MD 12/21/22 [phone]

## 2022-12-21 NOTE — ED Provider Notes (Signed)
  Provider Note MRN:  [id number]  Arrival date & time: 12/21/22    ED Course and Medical Decision Making  Assumed care from Rancour at shift change.  See note from prior team for complete details, in brief:  Clinical Course as of 12/21/22 0837  [facility] Dec 21, 2022  6606 Received at handoff from Dr Manus Gunning pending delta trop, can go home if negative. Pt feeling better overall, labs/imaging o/w stable  [SG]  0818 Delta trop neg [SG]    Clinical Course User Index [SG] Sloan Leiter, DO     Plan per prior physician f/u trop  Delta trop neg  Tolerating PO, pain resolved, feeling better overall  Carafate added by prior team, f/u Gi encouraged    The patient improved significantly and was discharged in stable condition. Detailed discussions were had with the patient regarding current findings, and need for close f/u with PCP or on call doctor. The patient has been instructed to return immediately if the symptoms worsen in any way for re-evaluation. Patient verbalized understanding and is in agreement with current care plan. All questions answered prior to discharge.    Procedures  Final Clinical Impressions(s) / ED Diagnoses     ICD-10-CM   1. Epigastric pain  R10.13       ED Discharge Orders          Ordered    sucralfate (CARAFATE) 1 g tablet  3 times daily with meals & bedtime        12/21/22 0641              Discharge Instructions      Your testing is reassuring.  Continue protonix as prescribed.  Avoid alcohol, caffeine, NSAID medications, spicy foods. Follow-up with your gastroenterologist for EGD as scheduled.  Return to the ED with worsening pain, nausea, vomiting, unable to eat or drink, chest pain, shortness of breath or other concerns.       Sloan Leiter, DO 12/21/22 [phone]

## 2022-12-21 NOTE — ED Notes (Signed)
Pt went to c-t 

## 2022-12-21 NOTE — ED Notes (Signed)
Patient was able to drink water without difficulty.

## 2022-12-24 DIAGNOSIS — M1711 Unilateral primary osteoarthritis, right knee: Secondary | ICD-10-CM | POA: Diagnosis not present

## 2023-01-04 NOTE — Telephone Encounter (Signed)
Patient needing refill for Carafate. Please advise.

## 2023-01-05 NOTE — Telephone Encounter (Signed)
Please advise on carafate refill, thank you.

## 2023-01-06 MED ORDER — SUCRALFATE 1 G PO TABS: 1.0000 g | ORAL_TABLET | Freq: Three times a day (TID) | ORAL | 2 refills | Status: DC

## 2023-01-06 NOTE — Telephone Encounter (Signed)
I spoke to Kurt Hall and told him I am sending in his carafate refill as requested. I advised him about not taking it a week prior to his procedure. He said he already has it marked on his calendar.

## 2023-01-06 NOTE — Addendum Note (Signed)
Addended by: [place], Fia Hebert E on: 01/06/2023 09:20 AM   Modules accepted: Orders

## 2023-01-06 NOTE — Telephone Encounter (Signed)
PT is calling to get an update on carafate prescription. Please advise.

## 2023-01-08 DIAGNOSIS — R070 Pain in throat: Secondary | ICD-10-CM | POA: Diagnosis not present

## 2023-01-08 DIAGNOSIS — R0981 Nasal congestion: Secondary | ICD-10-CM | POA: Diagnosis not present

## 2023-01-08 DIAGNOSIS — R509 Fever, unspecified: Secondary | ICD-10-CM | POA: Diagnosis not present

## 2023-01-08 DIAGNOSIS — R051 Acute cough: Secondary | ICD-10-CM | POA: Diagnosis not present

## 2023-01-17 DIAGNOSIS — R059 Cough, unspecified: Secondary | ICD-10-CM | POA: Diagnosis not present

## 2023-01-17 DIAGNOSIS — R0981 Nasal congestion: Secondary | ICD-10-CM | POA: Diagnosis not present

## 2023-01-20 ENCOUNTER — Encounter: Payer: Self-pay | Admitting: Internal Medicine

## 2023-01-20 ENCOUNTER — Ambulatory Visit: Payer: Medicare Other | Admitting: Internal Medicine

## 2023-01-20 VITALS — BP 116/78 | HR 65 | Temp 97.7°F | Resp 13 | Ht 68.0 in | Wt 180.0 lb

## 2023-01-20 DIAGNOSIS — K648 Other hemorrhoids: Secondary | ICD-10-CM

## 2023-01-20 DIAGNOSIS — R1084 Generalized abdominal pain: Secondary | ICD-10-CM

## 2023-01-20 DIAGNOSIS — K635 Polyp of colon: Secondary | ICD-10-CM | POA: Diagnosis not present

## 2023-01-20 DIAGNOSIS — Z860100 Personal history of colon polyps, unspecified: Secondary | ICD-10-CM

## 2023-01-20 DIAGNOSIS — K319 Disease of stomach and duodenum, unspecified: Secondary | ICD-10-CM | POA: Diagnosis not present

## 2023-01-20 DIAGNOSIS — K627 Radiation proctitis: Secondary | ICD-10-CM

## 2023-01-20 DIAGNOSIS — R634 Abnormal weight loss: Secondary | ICD-10-CM

## 2023-01-20 DIAGNOSIS — D120 Benign neoplasm of cecum: Secondary | ICD-10-CM

## 2023-01-20 DIAGNOSIS — K219 Gastro-esophageal reflux disease without esophagitis: Secondary | ICD-10-CM

## 2023-01-20 DIAGNOSIS — R12 Heartburn: Secondary | ICD-10-CM

## 2023-01-20 NOTE — Progress Notes (Signed)
Report to PACU, RN, vss, BBS= Clear.  

## 2023-01-20 NOTE — Op Note (Signed)
[facility] Patient Name: Kurt Hall Procedure Date: 01/20/2023 10:38 AM MRN: [id number] Endoscopist: Beverley Fiedler , MD, [id number] Age: 77 Referring MD:  Date of Birth: 1945/12/04 Gender: Male Account #: [id number] Procedure:                Colonoscopy Indications:              Generalized abdominal pain, Weight loss, hx of                            adenomatous and sessile serrated polyps, last                            colonoscopy Nov 2020 Medicines:                Monitored Anesthesia Care Procedure:                Pre-Anesthesia Assessment:                           - Prior to the procedure, a History and Physical                            was performed, and patient medications and                            allergies were reviewed. The patient's tolerance of                            previous anesthesia was also reviewed. The risks                            and benefits of the procedure and the sedation                            options and risks were discussed with the patient.                            All questions were answered, and informed consent                            was obtained. Prior Anticoagulants: The patient has                            taken no anticoagulant or antiplatelet agents. ASA                            Grade Assessment: II - A patient with mild systemic                            disease. After reviewing the risks and benefits,                            the patient was deemed in satisfactory condition to  undergo the procedure.                           After obtaining informed consent, the colonoscope                            was passed under direct vision. Throughout the                            procedure, the patient's blood pressure, pulse, and                            oxygen saturations were monitored continuously. The                            CF [id number] #[id number] was introduced through the  anus                            and advanced to the cecum, identified by                            appendiceal orifice and ileocecal valve. The                            colonoscopy was somewhat difficult due to                            restricted mobility of the sigmoid colon.                            Successful completion of the procedure was aided by                            withdrawing the scope and replacing with the                            UltraSlim scope. The patient tolerated the                            procedure well. The quality of the bowel                            preparation was good. The ileocecal valve,                            appendiceal orifice, and rectum were photographed. Scope In: 10:51:51 AM Scope Out: 11:17:06 AM Scope Withdrawal Time: 0 hours 14 minutes 8 seconds  Total Procedure Duration: 0 hours 25 minutes 15 seconds  Findings:                 The digital rectal exam was normal.                           A 2 mm polyp was found in the ileocecal valve. The  polyp was sessile. The polyp was removed with a                            cold biopsy forceps. Resection and retrieval were                            complete.                           Two sessile polyps were found in the cecum. The                            polyps were 4 to 6 mm in size. These polyps were                            removed with a cold snare. Resection and retrieval                            were complete.                           Scattered mild mucosal changes characterized by                            neovascularization were found in the distal rectum.                           Internal hemorrhoids were found during                            retroflexion. The hemorrhoids were small. Complications:            No immediate complications. Estimated Blood Loss:     Estimated blood loss was minimal. Impression:               - One 2 mm polyp at  the ileocecal valve, removed                            with a cold biopsy forceps. Resected and retrieved.                           - Two 4 to 6 mm polyps in the cecum, removed with a                            cold snare. Resected and retrieved.                           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                           - Internal hemorrhoids. Recommendation:           -  Patient has a contact number available for                            emergencies. The signs and symptoms of potential                            delayed complications were discussed with the                            patient. Return to normal activities tomorrow.                            Written discharge instructions were provided to the                            patient.                           - Resume previous diet.                           - Continue present medications.                           - Await pathology results.                           - Given symptoms and findings at CT-angiography                            from Nov 2024 I am suspicious for symptomatic                            internal hernia. I would recommend appointment with                            [facility] to discuss laparoscopic                            evaluation and potential treatment of internal                            hernia.                           - No recommendation at this time regarding repeat                            colonoscopy due to age. Beverley Fiedler, MD 01/20/2023 11:33:23 AM This report has been signed electronically.

## 2023-01-20 NOTE — Progress Notes (Signed)
Patient insists on dressing himself.

## 2023-01-20 NOTE — Patient Instructions (Addendum)
Resume all of your previous medications today.  Read all of your discharge instructions.  The doctor would suggest getting an appointment with [facility] too evaluate the hernia.  We will help you with the appointment.  YOU HAD AN ENDOSCOPIC PROCEDURE TODAY AT THE [facility]:   Refer to the procedure report that was given to you for any specific questions about what was found during the examination.  If the procedure report does not answer your questions, please call your gastroenterologist to clarify.  If you requested that your care partner not be given the details of your procedure findings, then the procedure report has been included in a sealed envelope for you to review at your convenience later.  YOU SHOULD EXPECT: Some feelings of bloating in the abdomen. Passage of more gas than usual.  Walking can help get rid of the air that was put into your GI tract during the procedure and reduce the bloating. If you had a lower endoscopy (such as a colonoscopy or flexible sigmoidoscopy) you may notice spotting of blood in your stool or on the toilet paper. If you underwent a bowel prep for your procedure, you may not have a normal bowel movement for a few days.  Please Note:  You might notice some irritation and congestion in your nose or some drainage.  This is from the oxygen used during your procedure.  There is no need for concern and it should clear up in a day or so.  SYMPTOMS TO REPORT IMMEDIATELY:  Following lower endoscopy (colonoscopy or flexible sigmoidoscopy):  Excessive amounts of blood in the stool  Significant tenderness or worsening of abdominal pains  Swelling of the abdomen that is new, acute  Fever of 100F or higher  Following upper endoscopy (EGD)  Vomiting of blood or coffee ground material  New chest pain or pain under the shoulder blades  Painful or persistently difficult swallowing  New shortness of breath  Fever of 100F or higher  Black,  tarry-looking stools  For urgent or emergent issues, a gastroenterologist can be reached at any hour by calling [phone]. Do not use MyChart messaging for urgent concerns.    DIET:  We do recommend a small meal at first, but then you may proceed to your regular diet.  Drink plenty of fluids but you should avoid alcoholic beverages for 24 hours.  ACTIVITY:  You should plan to take it easy for the rest of today and you should NOT DRIVE or use heavy machinery until tomorrow (because of the sedation medicines used during the test).    FOLLOW UP: Our staff will call the number listed on your records the next business day following your procedure.  We will call around 7:15- 8:00 am to check on you and address any questions or concerns that you may have regarding the information given to you following your procedure. If we do not reach you, we will leave a message.     If any biopsies were taken you will be contacted by phone or by letter within the next 1-3 weeks.  Please call us at [phone] if you have not heard about the biopsies in 3 weeks.    SIGNATURES/CONFIDENTIALITY: You and/or your care partner have signed paperwork which will be entered into your electronic medical record.  These signatures attest to the fact that that the information above on your After Visit Summary has been reviewed and is understood.  Full responsibility of the confidentiality of this discharge  information lies with you and/or your care-partner.

## 2023-01-20 NOTE — Op Note (Signed)
[facility] Patient Name: Kurt Hall Procedure Date: 01/20/2023 10:38 AM MRN: [id number] Endoscopist: Beverley Fiedler , MD, [id number] Age: 77 Referring MD:  Date of Birth: 1946-01-17 Gender: Male Account #: [id number] Procedure:                Upper GI endoscopy Indications:              Generalized abdominal pain, Heartburn, Weight loss                            (once daily PPI, pt reports improvement with                            addition of sucralfate) Medicines:                Monitored Anesthesia Care Procedure:                Pre-Anesthesia Assessment:                           - Prior to the procedure, a History and Physical                            was performed, and patient medications and                            allergies were reviewed. The patient's tolerance of                            previous anesthesia was also reviewed. The risks                            and benefits of the procedure and the sedation                            options and risks were discussed with the patient.                            All questions were answered, and informed consent                            was obtained. Prior Anticoagulants: The patient has                            taken no anticoagulant or antiplatelet agents. ASA                            Grade Assessment: II - A patient with mild systemic                            disease. After reviewing the risks and benefits,                            the patient was deemed in satisfactory condition to  undergo the procedure.                           After obtaining informed consent, the endoscope was                            passed under direct vision. Throughout the                            procedure, the patient's blood pressure, pulse, and                            oxygen saturations were monitored continuously. The                            Olympus Scope [id number] was  introduced through the                            mouth, and advanced to the second part of duodenum.                            The upper GI endoscopy was accomplished without                            difficulty. The patient tolerated the procedure                            well. Scope In: Scope Out: Findings:                 The examined esophagus was normal.                           The entire examined stomach was normal. Biopsies                            were taken with a cold forceps for histology and                            Helicobacter pylori testing.                           The examined duodenum was normal. Biopsies for                            histology were taken with a cold forceps for                            evaluation of celiac disease. Complications:            No immediate complications. Estimated Blood Loss:     Estimated blood loss was minimal. Impression:               - Normal esophagus.                           - Normal stomach.  Biopsied.                           - Normal examined duodenum. Biopsied. Recommendation:           - Patient has a contact number available for                            emergencies. The signs and symptoms of potential                            delayed complications were discussed with the                            patient. Return to normal activities tomorrow.                            Written discharge instructions were provided to the                            patient.                           - Resume previous diet.                           - Continue present medications.                           - Await pathology results.                           - See the other procedure note for documentation of                            additional recommendations. Beverley Fiedler, MD 01/20/2023 11:22:09 AM This report has been signed electronically.

## 2023-01-20 NOTE — Progress Notes (Signed)
GASTROENTEROLOGY PROCEDURE H&P NOTE   Primary Care Physician: Tally Joe, MD    Reason for Procedure:  Weight loss, reflux symptoms despite PPI, epigastric pain, history of colon polyps  Plan:    EGD and colonoscopy  Patient is appropriate for endoscopic procedure(s) in the ambulatory ([facility]) setting.  The nature of the procedure, as well as the risks, benefits, and alternatives were carefully and thoroughly reviewed with the patient. Ample time for discussion and questions allowed. The patient understood, was satisfied, and agreed to proceed.     HPI: Kurt Hall is a 77 y.o. male who presents for EGD and colonoscopy.  Medical history as below.  Tolerated the prep.  No recent chest pain or shortness of breath.  No abdominal pain today.  Subjective improvement in nocturnal heartburn and reflux with sucralfate 4 times daily  Past Medical History:  Diagnosis Date   Allergic rhinitis    Allergy    Cataract    Chronic kidney disease    Colon polyps    Dactylitis    GERD (gastroesophageal reflux disease)    Hemorrhoids    Hx of basal cell carcinoma    Hx of herpes genitalis    Hypercholesterolemia    Hypertension    Internal hemorrhoids    Prostate cancer (HCC)    followed by [facility]   Tubular adenoma of colon     Past Surgical History:  Procedure Laterality Date   APPENDECTOMY     C-spine surgery W/ fusion of cervical discs  2003   CATARACT EXTRACTION Left    colonoscopy     compound fracture of left leg     macular degeneration eye surgery Left    PROSTATE SURGERY     SMALL INTESTINE SURGERY     blockage     Prior to Admission medications   Medication Sig Start Date End Date Taking? Authorizing Provider  acetaminophen (TYLENOL) 500 MG tablet Take 500 mg by mouth at bedtime.   Yes [provider]  amLODipine (NORVASC) 10 MG tablet Take 10 mg by mouth daily.   Yes [provider]  aspirin 81 MG tablet Take 81 mg by mouth daily.   Yes  [provider]  atorvastatin (LIPITOR) 20 MG tablet Take 20 mg by mouth daily.   Yes [provider]  clonazePAM (KLONOPIN) 0.5 MG tablet Take 0.5 mg by mouth at bedtime.   Yes [provider]  cyanocobalamin (VITAMIN B12) 1000 MCG tablet Take 1,000 mcg by mouth daily.   Yes [provider]  hydrochlorothiazide (HYDRODIURIL) 12.5 MG tablet Take 12.5 mg by mouth every morning. 10/24/20  Yes [provider]  loratadine (CLARITIN) 10 MG tablet Take 10 mg by mouth daily. 06/27/19  Yes [provider]  pantoprazole (PROTONIX) 40 MG tablet Take 1 tablet (40 mg total) by mouth daily. 10/29/22  Yes McMichael, Bayley M, PA-C  telmisartan (MICARDIS) 80 MG tablet Take 80 mg by mouth daily.   Yes [provider]  azelastine (ASTELIN) 0.1 % nasal spray Place 1 spray into both nostrils daily. Use in each nostril as directed    [provider]  sucralfate (CARAFATE) 1 g tablet Take 1 tablet (1 g total) by mouth 4 (four) times daily -  with meals and at bedtime. 12/21/22   Glynn Octave, MD    Current Outpatient Medications  Medication Sig Dispense Refill   acetaminophen (TYLENOL) 500 MG tablet Take 500 mg by mouth at bedtime.  amLODipine (NORVASC) 10 MG tablet Take 10 mg by mouth daily.     aspirin 81 MG tablet Take 81 mg by mouth daily.     atorvastatin (LIPITOR) 20 MG tablet Take 20 mg by mouth daily.     clonazePAM (KLONOPIN) 0.5 MG tablet Take 0.5 mg by mouth at bedtime.     cyanocobalamin (VITAMIN B12) 1000 MCG tablet Take 1,000 mcg by mouth daily.     hydrochlorothiazide (HYDRODIURIL) 12.5 MG tablet Take 12.5 mg by mouth every morning.     loratadine (CLARITIN) 10 MG tablet Take 10 mg by mouth daily.     pantoprazole (PROTONIX) 40 MG tablet Take 1 tablet (40 mg total) by mouth daily. 90 tablet 3   telmisartan (MICARDIS) 80 MG tablet Take 80 mg by mouth daily.     azelastine (ASTELIN) 0.1 % nasal spray Place 1 spray into both  nostrils daily. Use in each nostril as directed     sucralfate (CARAFATE) 1 g tablet Take 1 tablet (1 g total) by mouth 4 (four) times daily -  with meals and at bedtime. 60 tablet 0   No current facility-administered medications for this visit.    Allergies as of 01/20/2023   (No Known Allergies)    Family History  Problem Relation Age of Onset   Colon cancer Mother        dx'd age 43's   Hypertension Mother    CVA Father    Hypertension Father    Colon cancer Sister 59       passed away    Heart attack Brother    Diabetes Brother    Hypertension Brother    Esophageal cancer Neg Hx    Rectal cancer Neg Hx    Stomach cancer Neg Hx     Social History   Socioeconomic History   Marital status: Divorced    Spouse name: Not on file   Number of children: 2   Years of education: Not on file   Highest education level: Not on file  Occupational History   Occupation: Retired    Associate Professor: [employer]  Tobacco Use   Smoking status: Former    Types: Cigarettes   Smokeless tobacco: Never  Vaping Use   Vaping status: Never Used  Substance and Sexual Activity   Alcohol use: Yes    Alcohol/week: 0.0 standard drinks of alcohol    Comment: socially   Drug use: No   Sexual activity: Not on file  Other Topics Concern   Not on file  Social History Narrative   Not on file   Social Determinants of Health   Financial Resource Strain: Not on file  Food Insecurity: Not on file  Transportation Needs: Not on file  Physical Activity: Not on file  Stress: Not on file  Social Connections: Not on file  Intimate Partner Violence: Not on file    Physical Exam: Vital signs in last 24 hours: @BP  131/76   Pulse 66   Temp 97.7 F (36.5 C)   Ht 5\' 8"  (1.727 m)   Wt 180 lb (81.6 kg)   SpO2 96%   BMI 27.37 kg/m  GEN: NAD EYE: Sclerae anicteric ENT: MMM CV: Non-tachycardic Pulm: CTA b/l GI: Soft, NT/ND NEURO:  Alert & Oriented x 3   Erick Blinks, MD [facility]  01/20/2023 10:30 AM

## 2023-01-21 ENCOUNTER — Telehealth: Payer: Self-pay

## 2023-01-21 NOTE — Telephone Encounter (Signed)
  Follow up Call-     01/20/2023   10:05 AM  Call back number  Post procedure Call Back phone  # [phone]  Permission to leave phone message Yes     Patient questions:  Do you have a fever, pain , or abdominal swelling? No. Pain Score  0 *  Have you tolerated food without any problems? Yes.    Have you been able to return to your normal activities? Yes.    Do you have any questions about your discharge instructions: Diet   No. Medications  No. Follow up visit  No.  Do you have questions or concerns about your Care? No.  Actions: * If pain score is 4 or above: No action needed, pain <4.

## 2023-01-22 LAB — SURGICAL PATHOLOGY

## 2023-01-25 ENCOUNTER — Encounter: Payer: Self-pay | Admitting: Internal Medicine

## 2023-02-04 DIAGNOSIS — K458 Other specified abdominal hernia without obstruction or gangrene: Secondary | ICD-10-CM | POA: Diagnosis not present

## 2023-02-25 DIAGNOSIS — G4700 Insomnia, unspecified: Secondary | ICD-10-CM | POA: Diagnosis not present

## 2023-02-25 DIAGNOSIS — I10 Essential (primary) hypertension: Secondary | ICD-10-CM | POA: Diagnosis not present

## 2023-02-25 DIAGNOSIS — K219 Gastro-esophageal reflux disease without esophagitis: Secondary | ICD-10-CM | POA: Diagnosis not present

## 2023-02-25 DIAGNOSIS — K5900 Constipation, unspecified: Secondary | ICD-10-CM | POA: Diagnosis not present

## 2023-03-11 DIAGNOSIS — R1013 Epigastric pain: Secondary | ICD-10-CM | POA: Diagnosis not present

## 2023-03-18 ENCOUNTER — Other Ambulatory Visit (HOSPITAL_COMMUNITY): Payer: Self-pay | Admitting: Surgery

## 2023-03-18 DIAGNOSIS — R1013 Epigastric pain: Secondary | ICD-10-CM

## 2023-03-24 DIAGNOSIS — H11042 Peripheral pterygium, stationary, left eye: Secondary | ICD-10-CM | POA: Diagnosis not present

## 2023-03-24 DIAGNOSIS — H02831 Dermatochalasis of right upper eyelid: Secondary | ICD-10-CM | POA: Diagnosis not present

## 2023-03-24 DIAGNOSIS — H02834 Dermatochalasis of left upper eyelid: Secondary | ICD-10-CM | POA: Diagnosis not present

## 2023-03-24 DIAGNOSIS — H04123 Dry eye syndrome of bilateral lacrimal glands: Secondary | ICD-10-CM | POA: Diagnosis not present

## 2023-03-27 ENCOUNTER — Other Ambulatory Visit: Payer: Self-pay | Admitting: Gastroenterology

## 2023-03-30 ENCOUNTER — Other Ambulatory Visit (HOSPITAL_COMMUNITY): Payer: Self-pay | Admitting: Surgery

## 2023-03-30 ENCOUNTER — Ambulatory Visit (HOSPITAL_COMMUNITY)
Admission: RE | Admit: 2023-03-30 | Discharge: 2023-03-30 | Disposition: A | Discharge status: Home / Self Care | Payer: Medicare Other | Source: Ambulatory Visit | Attending: Surgery | Admitting: Surgery

## 2023-03-30 DIAGNOSIS — R1013 Epigastric pain: Secondary | ICD-10-CM | POA: Insufficient documentation

## 2023-03-30 DIAGNOSIS — G8929 Other chronic pain: Secondary | ICD-10-CM | POA: Diagnosis not present

## 2023-03-30 DIAGNOSIS — R109 Unspecified abdominal pain: Secondary | ICD-10-CM | POA: Diagnosis not present

## 2023-03-30 DIAGNOSIS — K080 Exfoliation of teeth due to systemic causes: Secondary | ICD-10-CM | POA: Diagnosis not present

## 2023-04-01 DIAGNOSIS — R1013 Epigastric pain: Secondary | ICD-10-CM | POA: Diagnosis not present

## 2023-04-01 DIAGNOSIS — K458 Other specified abdominal hernia without obstruction or gangrene: Secondary | ICD-10-CM | POA: Diagnosis not present

## 2023-04-12 DIAGNOSIS — L821 Other seborrheic keratosis: Secondary | ICD-10-CM | POA: Diagnosis not present

## 2023-04-12 DIAGNOSIS — D225 Melanocytic nevi of trunk: Secondary | ICD-10-CM | POA: Diagnosis not present

## 2023-04-12 DIAGNOSIS — L570 Actinic keratosis: Secondary | ICD-10-CM | POA: Diagnosis not present

## 2023-04-12 DIAGNOSIS — L578 Other skin changes due to chronic exposure to nonionizing radiation: Secondary | ICD-10-CM | POA: Diagnosis not present

## 2023-04-13 ENCOUNTER — Telehealth: Payer: Self-pay

## 2023-04-13 NOTE — Telephone Encounter (Signed)
-----   Message from Carie Caddy Pyrtle sent at 04/12/2023 11:32 AM EST ----- Can we look for a followup spot with me Not urgent, but maybe when there is a cancellation?   Don't need to overbook at this point JMP ----- Message ----- From: Diamantina Monks, MD Sent: 04/01/2023   9:47 AM EST To: Jearld Lesch; Beverley Fiedler, MD  Dr. Rhea Belton,   I am reaching out about this mutual patient. I received a referral from you for internal hernia. I've now had three office visits with this patient exploring his symptoms and also obtained a small bowel follow through study. I think the internal hernia is a red herring. Parsing through his symptoms, he is describing a "sour taste in the back of his throat, worse when laying down" and waking up in the middle of the night with these symptoms. He reports compliance with the carafate QID and the protonix, including the increased dose to BID. He does state that he has noted some improvement of his symptoms, but the improvement is mostly related to daytime symptoms and that he is still having issues at night. I considered ordering a pH probe, but wanted to discuss with you first whether your opinion is that a clinical diagnosis is sufficient or if a pH probe would enable consideration of more advanced management options beyond medications or if you think I'm way off base and it's not indicated. I am happy to order it if you think it is appropriate.   He is also reporting significant constipation. Last visit, he said he wasn't having constipation and that he had discontinued a pro-motility agent. This visit he does not recall being prescribed a pro-motility agent. He is taking Activia and intermittently using Dulcolax. I recommended he resume his metamucil and increase his general fiber intake (I recommended the FiberOne brand) with a specific goal of 40g of fiber intake daily. He may benefit from resuming what you previously prescribed as well.   Happy to chat more via phone if  you prefer. My cell is [phone]  Kris Mouton

## 2023-04-13 NOTE — Telephone Encounter (Signed)
 Called patient and offered an appt for 04/14/23 with Dr. Rhea Belton. Patient states he has another appt so he declined the appt for tomorrow. Informed patient I will continue to have him on our cancellation list. Patient verbalized understanding.

## 2023-04-14 ENCOUNTER — Ambulatory Visit: Admitting: Internal Medicine

## 2023-04-22 DIAGNOSIS — C61 Malignant neoplasm of prostate: Secondary | ICD-10-CM | POA: Diagnosis not present

## 2023-04-22 DIAGNOSIS — I10 Essential (primary) hypertension: Secondary | ICD-10-CM | POA: Diagnosis not present

## 2023-04-22 DIAGNOSIS — N1831 Chronic kidney disease, stage 3a: Secondary | ICD-10-CM | POA: Diagnosis not present

## 2023-05-27 DIAGNOSIS — C61 Malignant neoplasm of prostate: Secondary | ICD-10-CM | POA: Diagnosis not present

## 2023-05-27 DIAGNOSIS — N5231 Erectile dysfunction following radical prostatectomy: Secondary | ICD-10-CM | POA: Diagnosis not present

## 2023-06-03 DIAGNOSIS — M170 Bilateral primary osteoarthritis of knee: Secondary | ICD-10-CM | POA: Diagnosis not present

## 2023-06-04 DIAGNOSIS — I10 Essential (primary) hypertension: Secondary | ICD-10-CM | POA: Diagnosis not present

## 2023-06-04 DIAGNOSIS — E782 Mixed hyperlipidemia: Secondary | ICD-10-CM | POA: Diagnosis not present

## 2023-06-04 DIAGNOSIS — G4700 Insomnia, unspecified: Secondary | ICD-10-CM | POA: Diagnosis not present

## 2023-06-04 DIAGNOSIS — K5900 Constipation, unspecified: Secondary | ICD-10-CM | POA: Diagnosis not present

## 2023-06-10 DIAGNOSIS — M170 Bilateral primary osteoarthritis of knee: Secondary | ICD-10-CM | POA: Diagnosis not present

## 2023-06-17 DIAGNOSIS — M170 Bilateral primary osteoarthritis of knee: Secondary | ICD-10-CM | POA: Diagnosis not present

## 2023-07-01 DIAGNOSIS — K458 Other specified abdominal hernia without obstruction or gangrene: Secondary | ICD-10-CM | POA: Diagnosis not present

## 2023-07-01 DIAGNOSIS — K5900 Constipation, unspecified: Secondary | ICD-10-CM | POA: Diagnosis not present

## 2023-07-19 ENCOUNTER — Ambulatory Visit: Admitting: Internal Medicine

## 2023-07-21 ENCOUNTER — Encounter: Payer: Self-pay | Admitting: Internal Medicine

## 2023-07-21 ENCOUNTER — Ambulatory Visit: Admitting: Internal Medicine

## 2023-07-21 VITALS — BP 116/70 | HR 97 | Ht 68.0 in | Wt 179.5 lb

## 2023-07-21 DIAGNOSIS — K5904 Chronic idiopathic constipation: Secondary | ICD-10-CM | POA: Diagnosis not present

## 2023-07-21 DIAGNOSIS — R1013 Epigastric pain: Secondary | ICD-10-CM

## 2023-07-21 DIAGNOSIS — K8689 Other specified diseases of pancreas: Secondary | ICD-10-CM | POA: Diagnosis not present

## 2023-07-21 DIAGNOSIS — K862 Cyst of pancreas: Secondary | ICD-10-CM

## 2023-07-21 DIAGNOSIS — R1030 Lower abdominal pain, unspecified: Secondary | ICD-10-CM

## 2023-07-21 DIAGNOSIS — K219 Gastro-esophageal reflux disease without esophagitis: Secondary | ICD-10-CM

## 2023-07-21 MED ORDER — PRUCALOPRIDE SUCCINATE 2 MG PO TABS: 2.0000 mg | ORAL_TABLET | Freq: Every day | ORAL | 5 refills | Status: DC

## 2023-07-21 MED ORDER — SUCRALFATE 1 G PO TABS: 1.0000 g | ORAL_TABLET | ORAL | Status: DC | PRN

## 2023-07-21 MED ORDER — PANTOPRAZOLE SODIUM 40 MG PO TBEC: 40.0000 mg | DELAYED_RELEASE_TABLET | Freq: Two times a day (BID) | ORAL | 3 refills | Status: AC

## 2023-07-21 NOTE — Progress Notes (Signed)
 Subjective:    Patient ID: Kurt Hall, male    DOB: Feb 25, 1945, 78 y.o.   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  He is here alone today.  He has a history of chronic digestive issues, including difficulty eating and sleeping, which led to a recent hospital visit. During this visit, a CT scan suggested a possible internal hernia, but further imaging did not confirm this. He experiences constant upper abdominal pain that worsens at night, especially when lying down. This pain has improved with medication adjustments, including an increase in pantoprazole  to 40 mg twice daily and the use of Carafate  four times daily.  He reports ongoing issues with bowel movements, describing them as small in size and amount, even with the use of Dulcolax every other night. He experiences lower abdominal pain, which he attributes to gas, as it improves with movement and passing gas. Previous trials of Linzess  and Amitiza  resulted in diarrhea, and he has been using Dulcolax to manage constipation, though it sometimes causes excessive bowel movements.  His past medical history includes an appendectomy as a teenager due to similar symptoms, and prostate surgery in 2012. He has a history of scar tissue and adhesions from previous surgeries. He also reports hemorrhoid flare-ups related to his bowel issues.   Review of Systems As per HPI, otherwise negative  Current Medications, Allergies, Past Medical History, Past Surgical History, Family History and Social History were reviewed in [employer] record.    Objective:   Physical Exam BP 116/70   Pulse 97   Ht 5' 8 (1.727 m)   Wt 179 lb 8 oz (81.4 kg)   SpO2 97%   BMI 27.29 kg/m  Gen: awake, alert, NAD HEENT:  anicteric  Abd: soft, NT/ND, +BS throughout Ext: no c/c/e Neuro: nonfocal  CTA ABDOMEN AND PELVIS WITHOUT AND WITH CONTRAST   TECHNIQUE: Multidetector CT imaging of the abdomen and pelvis was performed using the standard protocol during bolus administration of intravenous contrast. Multiplanar reconstructed images and MIPs were obtained and reviewed to evaluate the vascular anatomy.   RADIATION DOSE REDUCTION: This exam was performed according to the departmental dose-optimization program which includes automated exposure control, adjustment of the mA and/or kV according to patient size and/or use of iterative reconstruction technique.   CONTRAST:  OMNIPAQUE  IOHEXOL  350 MG/ML SOLN   COMPARISON:  MRI abdomen 11/06/2022 and abdominal ultrasound 06/02/2016   FINDINGS: VASCULAR   Scattered calcified atherosclerotic plaque without hemodynamically significant stenosis. No aortic aneurysm or dissection. The mesenteric and renal arteries are widely patent. Widely patent inflow and outflow arteries bilaterally.   Review of the MIP images confirms the above findings.   NON-VASCULAR   Lower chest: No acute abnormality.   Hepatobiliary: Hepatic cysts. Unremarkable gallbladder and biliary tree.   Pancreas: Unremarkable.   Spleen: Unremarkable.   Adrenals/Urinary Tract: Unremarkable adrenal glands. Bilateral cortical renal scarring. Multiple cystic lesions in both kidneys some of which are too small to characterize but likely represent benign cysts. No follow-up recommended. No ureteral calculi or hydronephrosis. Unremarkable bladder.   Stomach/Bowel: Stomach is within normal limits. Ligament of Treitz is in the left upper quadrant. There is an unusual course of the proximal jejunum which crosses over midline with the majority of jejunum located in the  right hemiabdomen. No bowel wall thickening or abnormal enhancement. No evidence of bowel obstruction. The appendix is  not visualized.   Lymphatic: No lymphadenopathy.   Reproductive: No acute abnormality.   Other: No free intraperitoneal fluid or air.   Musculoskeletal: No acute fracture or destructive osseous lesion.   IMPRESSION: 1. No acute abnormality in the abdomen or pelvis. 2. Unusual course of the proximal jejunum which crosses over midline with the majority of jejunum located in the right hemiabdomen. This may be normal variation however appears new since MRI abdomen 11/06/2022 and internal hernia is difficult to exclude. No evidence of bowel obstruction or ischemia.   Aortic Atherosclerosis (ICD10-I70.0).     Electronically Signed   By: Rozell Cornet M.D.   On: 12/21/2022 04:05      SMALL BOWEL SERIES   COMPARISON:  CTA of the abdomen of 12/21/2022. Abdominal MRIs including back to 01/09/2021.   TECHNIQUE: Following ingestion of thin barium, serial small bowel images were obtained including spot views of the terminal ileum.   FLUOROSCOPY: Radiation Exposure Index (if provided by the fluoroscopic device): 1.2 mGy   Number of Acquired Spot Images: 0   FINDINGS: Preprocedure scout film demonstrates a nonobstructive bowel-gas pattern.   20 minute image demonstrates the majority of proximal small bowel loops to be positioned right of midline, as detailed on CTA. Subsequent filling of mid to distal jejunal loops in the left side of the abdomen. No preferential dilatation of the right-sided jejunal loops. No abnormal twisting or delayed contrast opacification distally.   Normal small bowel morphology, without wall or fold thickening.   Normal small bowel transit time at less than 100 minutes. Normal terminal ileum.   IMPRESSION: Again identified are proximal jejunal loops primarily positioned within the right-side of the abdomen. When comparing to the 12/21/2022 CTA to the 01/09/2021 MRI, this appearance is felt to be similar, favoring normal variation. No secondary  findings on today's exam or the prior CTA to suggest obstruction or internal hernia.     Electronically Signed   By: Lore Rode M.D.   On: 03/30/2023 12:22   Upper endoscopy 01/20/2023 Normal esophagus, normal stomach, normal duodenum Pathology biopsies from the small bowel normal, stomach focal epithelial changes no H. 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  Chronic idiopathic constipation Persistent symptoms despite Dulcolax. Previous Linzess  and Amitiza  trials caused diarrhea. Lower abdominal pain likely due to gas, improves with movement and passing gas. Plan to initiate Motegrity  to enhance gut motility  - Check insurance coverage for Motegrity  and attempt prior authorization if necessary. - Prescribe Motegrity  2mg  once daily if approved. - Instruct to take Dulcolax every night until Motegrity  is started. - Discontinue Dulcolax once Motegrity  is started.  Lower abdominal pain Associated with gas and bloating, likely related to constipation. Improves with movement and passing gas. - Address constipation with Motegrity  and Dulcolax as outlined in the constipation plan.  Gastroesophageal reflux disease (GERD)/epigastric abdominal pain most consistent with dyspepsia given improvement Constant epigastric pain, worse at night  and when lying down, improved with pantoprazole  increased to 40 mg twice daily. Carafate  r also seems to have helped.  He had questions about vonoprazan but  given current control I will not switch to this for now - Continue pantoprazole  40 mg twice daily. - Change Carafate  to as needed instead of four times daily.  Small sidebranch IPMN Stable and no further imaging needed  30 minutes total spent today including patient facing time, coordination of care, reviewing medical history/procedures/pertinent radiology studies, and documentation of the encounter.

## 2023-07-21 NOTE — Patient Instructions (Addendum)
 Take dulcolax daily at bedtime.  Continue Carafate  as needed  Continue pantoprazole  40 mg twice daily  We have sent the following medications to your pharmacy for you to pick up at your convenience: Motegrity - take once daily (stop dulcolax when you start motegrity )  Please follow up in September.  Call next month and that schedule should be open.  Thank you for entrusting me with your care and for choosing [facility], Dr. Laurell Pond   If your blood pressure at your visit was 140/90 or greater, please contact your primary care physician to follow up on this. ______________________________________________________  If you are age 71 or older, your body mass index should be between 23-30. Your Body mass index is 27.29 kg/m. If this is out of the aforementioned range listed, please consider follow up with your Primary Care Provider.  If you are age 78 or younger, your body mass index should be between 19-25. Your Body mass index is 27.29 kg/m. If this is out of the aformentioned range listed, please consider follow up with your Primary Care Provider.  ________________________________________________________  The [facility] providers would like to encourage you to use MYCHART to communicate with providers for non-urgent requests or questions.  Due to long hold times on the telephone, sending your provider a message by [facility] may be a faster and more efficient way to get a response.  Please allow 48 business hours for a response.  Please remember that this is for non-urgent requests.  _______________________________________________________  Due to recent changes in healthcare laws, you may see the results of your imaging and laboratory studies on MyChart before your provider has had a chance to review them.  We understand that in some cases there may be results that are confusing or concerning to you. Not all laboratory results come back in the same time frame and the provider may be  waiting for multiple results in order to interpret others.  Please give us  48 hours in order for your provider to thoroughly review all the results before contacting the office for clarification of your results.

## 2023-07-21 NOTE — Telephone Encounter (Signed)
 I'm not able to find a letter and our submission only states that it's a duplicate request. I do see on patient chart that he was able to fill and pick up once on 10.23.24. I have no further information past that.

## 2023-07-22 ENCOUNTER — Other Ambulatory Visit (HOSPITAL_COMMUNITY): Payer: Self-pay

## 2023-07-27 DIAGNOSIS — G629 Polyneuropathy, unspecified: Secondary | ICD-10-CM | POA: Diagnosis not present

## 2023-07-27 DIAGNOSIS — I10 Essential (primary) hypertension: Secondary | ICD-10-CM | POA: Diagnosis not present

## 2023-07-27 DIAGNOSIS — K5900 Constipation, unspecified: Secondary | ICD-10-CM | POA: Diagnosis not present

## 2023-07-27 DIAGNOSIS — R600 Localized edema: Secondary | ICD-10-CM | POA: Diagnosis not present

## 2023-09-18 ENCOUNTER — Other Ambulatory Visit: Payer: Self-pay | Admitting: Gastroenterology

## 2023-10-04 DIAGNOSIS — M79609 Pain in unspecified limb: Secondary | ICD-10-CM | POA: Diagnosis not present

## 2023-10-04 DIAGNOSIS — M5489 Other dorsalgia: Secondary | ICD-10-CM | POA: Diagnosis not present

## 2023-10-04 DIAGNOSIS — R2232 Localized swelling, mass and lump, left upper limb: Secondary | ICD-10-CM | POA: Diagnosis not present

## 2023-10-14 NOTE — Progress Notes (Signed)
 [facility]: Chronic Kidney Disease Follow Up  Visit  REFERRING PROVIDER:   Self PRIMARY CARE PROVIDER:  Seabron Alm Fallow, MD  PATIENT IDENTIFIER: Kurt Hall is a 78 y.o. male here for CKD follow up.   PROBLEM LIST:  Patient Active Problem List  Diagnosis  . Prostate cancer (CMS/HHS-HCC)  . ED (erectile dysfunction)  . Stage 3a chronic kidney disease (CMS/HHS-HCC)  . Essential hypertension  . Other specified diseases of liver  . Liver cyst  . Kidney lesion, native, right    CURRENT MEDICATIONS: Current Outpatient Medications  Medication Sig Dispense Refill  . acetaminophen (TYLENOL) 500 MG tablet Take 500 mg by mouth at bedtime as needed for Pain Takes most nights for bilateral knee pain    . amLODIPine (NORVASC) 10 MG tablet Take 5 mg by mouth once daily    . aspirin 81 mg tablet 1 tab by mouth daily    . atorvastatin (LIPITOR) 20 MG tablet Take 20 mg by mouth once daily       . azelastine (ASTEPRO) 0.15 % (205.5 mcg) nasal spray Place 1 spray into both nostrils 2 (two) times daily    . clonazePAM (KLONOPIN) 0.5 MG tablet 1 tab by mouth daily as needed    . cyanocobalamin  (VITAMIN B12) 1000 MCG tablet Take 1,000 mcg by mouth once daily    . fexofenadine (ALLEGRA) 180 MG tablet Take 180 mg by mouth once daily    . hydroCHLOROthiazide (HYDRODIURIL) 25 MG tablet Take 12.5 mg by mouth once daily    . pantoprazole  (PROTONIX ) 40 MG DR tablet Take 40 mg by mouth once daily    . potassium chloride  (KLOR-CON ) 20 MEQ ER tablet Take 20 mEq by mouth once daily    . sucralfate  (CARAFATE ) 1 gram tablet Take 1 g by mouth    . telmisartan (MICARDIS) 80 MG tablet Take 1 tablet by mouth once daily     No current facility-administered medications for this visit.   Meds reviewed with pt  ALLERGIES: No Known Allergies  HISTORY:  Kurt Hall is here today today for CKD follow up after his follow up evaluation 04/2023. At that time: No changes made.  Creatinine up slightly at 2.4 with  normal electrolytes.  Hemoglobin normal.  Iron sat normal.  He reports that he is overall feeling well with improved abdominal symptoms since starting pantoprazole , sucralfate , and improved lower extremity edema. Since changing to telmisartan, his BP is typically ~130/80 at home, and today 122/75. He denies hesitancy/ trouble voiding, dysuria, hematuria. He denies shortness of breath, dizziness, nausea, vomiting. He reports maintaining adequate hydration, and remains active. Had been taking allegra-D.     REVIEW OF SYSTEM:  10-system review performed and negative in detail except as described above.     PHYSICAL EXAM: Vitals:   10/28/23 1325  BP: (!) 159/90  Pulse: 81  Temp: 36.6 C (97.8 F)   Repeat BP seated Rt arm with large cuff:  Vitals:   10/28/23 1401  BP: 135/80  Pulse:   Temp:       Ht:  Wt:82.7 kg (182 lb 5.1 oz) ADJ:Anib surface area is 1.99 meters squared.  Physical Exam Vitals and nursing note reviewed.  Constitutional:      Appearance: Normal appearance.  HENT:     Head: Normocephalic.     Mouth/Throat:     Mouth: Mucous membranes are moist.  Eyes:     General: No scleral icterus.    Extraocular Movements: Extraocular movements intact.  Conjunctiva/sclera: Conjunctivae normal.     Pupils: Pupils are equal, round, and reactive to light.  Neck:     Vascular: No carotid bruit.  Cardiovascular:     Rate and Rhythm: Normal rate and regular rhythm.     Pulses: Normal pulses.     Heart sounds: Normal heart sounds. No murmur heard.    No friction rub. No gallop.  Pulmonary:     Effort: Pulmonary effort is normal. No respiratory distress.     Breath sounds: Normal breath sounds. No stridor. No wheezing, rhonchi or rales.  Abdominal:     Palpations: Abdomen is soft.     Tenderness: There is no abdominal tenderness.  Musculoskeletal:        General: Swelling present.     Cervical back: Neck supple. No rigidity. No muscular tenderness.     Comments:  Trace edema  Lymphadenopathy:     Cervical: No cervical adenopathy.  Skin:    General: Skin is warm and dry.     Coloration: Skin is not jaundiced.  Neurological:     General: No focal deficit present.     Mental Status: He is alert and oriented to person, place, and time.  Psychiatric:        Mood and Affect: Mood normal.        Behavior: Behavior normal.        Thought Content: Thought content normal.        Judgment: Judgment normal.    LABORATORY DATA:  Labs reviewed in EPIC  ASSESSMENT and Plan: 1. BP: ok, no changes. Cont. Telmisartan. Reminded not to take D containing antihistamines.  2. CKD: Of unclear duration. Given lack of proteinuria likely related to vascular/hypertensive disease.  Renal panel. 3. Electrolytes: Adjust medications depending on calcium/phosphorus/potassium and bicarbonate levels. 4. Liver cyst: No suspicious lesions by MRI 01/2021. Noted to have iron deposition on MRI abdomen in 09/24.    5. Renal lesion: MRI done in December 2022 did not show any suggestive lesions in the kidney in terms of cysts.  There was cortical thinning bilaterally, possible lesion in pancreas. Repeat MRI in 2024 showed multiple simple cysts throughout kidneys and small cyst in anterior mid R kidney all favored to be benign without need for follow up.  6. Prostate CA: Followed by urology.   S/p radical prostatectomy with salvage radiation, active PSA monitoring.  Return in about 4 months (around 02/27/2024) for To see me.       Sherwood KYM Needy MD, CM, FRCP(C), FACP, FASN Professor of Medicine [facility]

## 2023-10-19 DIAGNOSIS — K080 Exfoliation of teeth due to systemic causes: Secondary | ICD-10-CM | POA: Diagnosis not present

## 2023-10-24 ENCOUNTER — Other Ambulatory Visit: Payer: Self-pay | Admitting: Gastroenterology

## 2023-10-28 DIAGNOSIS — I10 Essential (primary) hypertension: Secondary | ICD-10-CM | POA: Diagnosis not present

## 2023-10-28 DIAGNOSIS — N1831 Chronic kidney disease, stage 3a: Secondary | ICD-10-CM | POA: Diagnosis not present

## 2023-11-14 ENCOUNTER — Other Ambulatory Visit: Payer: Self-pay | Admitting: Gastroenterology

## 2023-11-15 ENCOUNTER — Encounter: Payer: Self-pay | Admitting: Internal Medicine

## 2023-11-15 DIAGNOSIS — M25512 Pain in left shoulder: Secondary | ICD-10-CM | POA: Diagnosis not present

## 2023-11-15 DIAGNOSIS — M542 Cervicalgia: Secondary | ICD-10-CM | POA: Diagnosis not present

## 2023-11-22 DIAGNOSIS — M25512 Pain in left shoulder: Secondary | ICD-10-CM | POA: Diagnosis not present

## 2023-11-23 ENCOUNTER — Emergency Department (HOSPITAL_COMMUNITY)
Admission: EM | Admit: 2023-11-23 | Discharge: 2023-11-23 | Disposition: A | Discharge status: Home / Self Care | Attending: Emergency Medicine | Admitting: Emergency Medicine

## 2023-11-23 ENCOUNTER — Other Ambulatory Visit: Payer: Self-pay

## 2023-11-23 DIAGNOSIS — M25512 Pain in left shoulder: Secondary | ICD-10-CM | POA: Diagnosis not present

## 2023-11-23 DIAGNOSIS — Z7982 Long term (current) use of aspirin: Secondary | ICD-10-CM | POA: Insufficient documentation

## 2023-11-23 DIAGNOSIS — Z79899 Other long term (current) drug therapy: Secondary | ICD-10-CM | POA: Insufficient documentation

## 2023-11-23 DIAGNOSIS — I10 Essential (primary) hypertension: Secondary | ICD-10-CM | POA: Diagnosis not present

## 2023-11-23 DIAGNOSIS — R202 Paresthesia of skin: Secondary | ICD-10-CM | POA: Diagnosis not present

## 2023-11-23 DIAGNOSIS — R7989 Other specified abnormal findings of blood chemistry: Secondary | ICD-10-CM | POA: Diagnosis not present

## 2023-11-23 DIAGNOSIS — E785 Hyperlipidemia, unspecified: Secondary | ICD-10-CM | POA: Diagnosis not present

## 2023-11-23 DIAGNOSIS — G8929 Other chronic pain: Secondary | ICD-10-CM | POA: Insufficient documentation

## 2023-11-23 NOTE — ED Provider Notes (Signed)
  EMERGENCY DEPARTMENT AT [facility] Provider Note   CSN: [id number] Arrival date & time: 11/23/23  9175     Patient presents with: No chief complaint on file.   Kurt Hall is a 78 y.o. male.  78 year old male presents to the ED with complaints of left shoulder painwith associated tingling down his left arm.  Patient advises this has been going on for several months but was exacerbated August 25 while he was at the beach.  He reports carrying a cooler up some stairs in his left shoulder began to hurt significantly more.  He went to urgent care and was given a steroid shot and muscle relaxers with relief of symptoms.  He reports the pain started to get worse again and he went to [facility] on October 1.  X-rays were taken with no pertinent findings and he was given a muscle relaxer and prednisone.  Patient advised this did not help and he has another appointment with him on 20 October.  Patient has tried Tylenol, lidocaine patches, and massager guns without complete relief.  Patient reports he has full range of motion in his shoulder and no loss of strength.  He advises he cannot sleep well through the night because of the pain.  Patient denies any trauma or recent falls.  Patient denies any associated chest pain, shortness of breath, dizziness, weakness.     Prior to Admission medications   Medication Sig Start Date End Date Taking? Authorizing Provider  acetaminophen (TYLENOL) 500 MG tablet Take 500 mg by mouth at bedtime.    [provider]  amLODipine (NORVASC) 10 MG tablet Take 10 mg by mouth daily.    [provider]  aspirin 81 MG tablet Take 81 mg by mouth daily.    [provider]  atorvastatin (LIPITOR) 20 MG tablet Take 20 mg by mouth daily.    [provider]  azelastine (ASTELIN) 0.1 % nasal spray Place 1 spray into both nostrils daily. Use in each nostril as directed    [provider]  clonazePAM (KLONOPIN) 0.5  MG tablet Take 0.5 mg by mouth at bedtime.    [provider]  cyanocobalamin  (VITAMIN B12) 1000 MCG tablet Take 1,000 mcg by mouth daily.    [provider]  fexofenadine (ALLEGRA) 180 MG tablet Take 180 mg by mouth daily.    [provider]  hydrochlorothiazide (HYDRODIURIL) 12.5 MG tablet Take 12.5 mg by mouth every morning. 10/24/20   [provider]  pantoprazole  (PROTONIX ) 40 MG tablet Take 1 tablet (40 mg total) by mouth 2 (two) times daily. 07/21/23   Pyrtle, Gordy HERO, MD  Prucalopride Succinate  (MOTEGRITY ) 2 MG TABS Take 1 tablet (2 mg total) by mouth daily. 07/21/23   Pyrtle, Gordy HERO, MD  sucralfate  (CARAFATE ) 1 g tablet TAKE 1 TABLET BY MOUTH 4 TIMES DAILY WITH MEALS AND AT BEDTIME 11/15/23   Pyrtle, Gordy HERO, MD  telmisartan (MICARDIS) 80 MG tablet Take 80 mg by mouth daily.    [provider]    Allergies: Patient has no known allergies.    Review of Systems  Musculoskeletal:  Positive for arthralgias and myalgias.  All other systems reviewed and are negative.   Updated Vital Signs BP (!) 142/101 (BP Location: Right Arm)   Pulse 78   Temp 98 F (36.7 C) (Oral)   Resp 17   Ht 5' 8 (1.727 m)   Wt 82.6 kg   SpO2 100%   BMI 27.67  kg/m   Physical Exam Vitals and nursing note reviewed.  Constitutional:      Appearance: Normal appearance.  HENT:     Head: Normocephalic and atraumatic.     Nose: Nose normal.  Eyes:     Extraocular Movements: Extraocular movements intact.     Conjunctiva/sclera: Conjunctivae normal.     Pupils: Pupils are equal, round, and reactive to light.  Cardiovascular:     Rate and Rhythm: Normal rate.  Pulmonary:     Effort: Pulmonary effort is normal. No respiratory distress.  Musculoskeletal:        General: Normal range of motion.     Cervical back: Normal range of motion. No rigidity or tenderness.  Skin:    General: Skin is warm.     Capillary Refill: Capillary refill takes less than 2 seconds.      Findings: No rash.  Neurological:     General: No focal deficit present.     Mental Status: He is alert. Mental status is at baseline.  Psychiatric:        Mood and Affect: Mood normal.        Behavior: Behavior normal.     (all labs ordered are listed, but only abnormal results are displayed) Labs Reviewed - No data to display  EKG: None  Radiology: No results found.   Procedures   Medications Ordered in the ED - No data to display  78 y.o. male presents to the ED with complaints of left shoulder pain with associated tingling down his left arm., this involves an extensive number of treatment options, and is a complaint that carries with it a high risk of complications and morbidity.  The differential diagnosis includes arthritis, fracture, ligamentous tear, cervical spine etiologies, muscle strain, muscle spasm, (Ddx)  On arrival pt is nontoxic, vitals hypertensive no other noted abnormalities.   Additional history obtained from chart review significant for nephrology follow up with [facility] for CKD stage III  ED Course:   78 year old male presents ED with complaints of left shoulder pain since August with associated tingling down his left arm.  See HPI for full story.  On exam patient is nontoxic-appearing and in no acute distress.  He is using his left arm to go through his belongings at bedside.  Patient has no decreased strength in the left arm.  Patient has full range of motion in his shoulders without evidence of pain on range of motion.  Patient has good sensation and cap refill in the left arm.  No pain to palpation throughout left shoulder, left elbow, or shoulder blade.  No neurodeficits on exam and patient is ambulatory at bedside.  No rashes noted on the skin.  There is no pain to palpation down the cervical or thoracic spine.  Pain is exacerbated with certain positions and movements.  Patient reports that his pain is worse at night when he lays down and he has tried  numerous different positions without relief of discomfort.  Patient has had multiple courses of steroids and muscle relaxers from orthopedics and urgent care without significant relief.  Patient has a follow-up with orthopedics scheduled for Monday and physical therapy later this month.  It was advised with patient to follow-up with orthopedics for further management and treatment.  Patient was advised to use Tylenol every 6 hours for pain with lidocaine patches that have already been prescribed by urgent care.  Patient advised that he would go to the [facility] facility today for a walk-in appointment.  Portions of this note were generated with Scientist, clinical (histocompatibility and immunogenetics). Dictation errors may occur despite best attempts at proofreading.   Final diagnoses:  Chronic left shoulder pain    ED Discharge Orders     None          Myriam Fonda GORMAN DEVONNA 11/23/23 2030    Francesca Elsie CROME, MD 11/24/23 [phone]

## 2023-11-23 NOTE — ED Triage Notes (Addendum)
 Pt. Stated, Jesus had left arm pain and tingling since August. I was given 2 medications and they are not working. Its also on my left shoulder blade.Ive gone to 2 other places of UC and never told me anything. I got to [facility] next Monday

## 2023-11-23 NOTE — Discharge Instructions (Signed)
 Please follow-up with your orthopedic doctor and follow all the recommendations for further management.    If you experience any new symptoms including severe shortness of breath, chest pain, fainting, dizziness please return to the ED for further evaluation.

## 2023-11-24 DIAGNOSIS — R202 Paresthesia of skin: Secondary | ICD-10-CM | POA: Diagnosis not present

## 2023-11-24 DIAGNOSIS — M25522 Pain in left elbow: Secondary | ICD-10-CM | POA: Diagnosis not present

## 2023-11-24 DIAGNOSIS — M25512 Pain in left shoulder: Secondary | ICD-10-CM | POA: Diagnosis not present

## 2023-11-24 DIAGNOSIS — G8929 Other chronic pain: Secondary | ICD-10-CM | POA: Diagnosis not present

## 2023-11-24 DIAGNOSIS — I10 Essential (primary) hypertension: Secondary | ICD-10-CM | POA: Diagnosis not present

## 2023-11-29 DIAGNOSIS — M5412 Radiculopathy, cervical region: Secondary | ICD-10-CM | POA: Diagnosis not present

## 2023-12-08 DIAGNOSIS — M5412 Radiculopathy, cervical region: Secondary | ICD-10-CM | POA: Diagnosis not present

## 2023-12-08 DIAGNOSIS — S46111A Strain of muscle, fascia and tendon of long head of biceps, right arm, initial encounter: Secondary | ICD-10-CM | POA: Diagnosis not present

## 2023-12-09 DIAGNOSIS — M5412 Radiculopathy, cervical region: Secondary | ICD-10-CM | POA: Diagnosis not present

## 2023-12-10 ENCOUNTER — Other Ambulatory Visit: Payer: Self-pay | Admitting: Internal Medicine

## 2023-12-14 DIAGNOSIS — M25512 Pain in left shoulder: Secondary | ICD-10-CM | POA: Diagnosis not present

## 2023-12-16 DIAGNOSIS — M170 Bilateral primary osteoarthritis of knee: Secondary | ICD-10-CM | POA: Diagnosis not present

## 2023-12-16 DIAGNOSIS — M25552 Pain in left hip: Secondary | ICD-10-CM | POA: Diagnosis not present

## 2023-12-16 DIAGNOSIS — M25551 Pain in right hip: Secondary | ICD-10-CM | POA: Diagnosis not present

## 2023-12-22 DIAGNOSIS — M170 Bilateral primary osteoarthritis of knee: Secondary | ICD-10-CM | POA: Diagnosis not present

## 2023-12-29 DIAGNOSIS — M170 Bilateral primary osteoarthritis of knee: Secondary | ICD-10-CM | POA: Diagnosis not present

## 2023-12-30 DIAGNOSIS — G959 Disease of spinal cord, unspecified: Secondary | ICD-10-CM | POA: Diagnosis not present

## 2024-01-04 DIAGNOSIS — Z1331 Encounter for screening for depression: Secondary | ICD-10-CM | POA: Diagnosis not present

## 2024-01-04 DIAGNOSIS — G4700 Insomnia, unspecified: Secondary | ICD-10-CM | POA: Diagnosis not present

## 2024-01-04 DIAGNOSIS — Z8546 Personal history of malignant neoplasm of prostate: Secondary | ICD-10-CM | POA: Diagnosis not present

## 2024-01-04 DIAGNOSIS — I10 Essential (primary) hypertension: Secondary | ICD-10-CM | POA: Diagnosis not present

## 2024-01-04 DIAGNOSIS — Z0000 Encounter for general adult medical examination without abnormal findings: Secondary | ICD-10-CM | POA: Diagnosis not present

## 2024-01-04 DIAGNOSIS — E782 Mixed hyperlipidemia: Secondary | ICD-10-CM | POA: Diagnosis not present

## 2024-01-04 DIAGNOSIS — M5031 Other cervical disc degeneration,  high cervical region: Secondary | ICD-10-CM | POA: Diagnosis not present

## 2024-01-04 DIAGNOSIS — Z08 Encounter for follow-up examination after completed treatment for malignant neoplasm: Secondary | ICD-10-CM | POA: Diagnosis not present

## 2024-01-10 ENCOUNTER — Other Ambulatory Visit: Payer: Self-pay | Admitting: Internal Medicine

## 2024-01-11 DIAGNOSIS — M2578 Osteophyte, vertebrae: Secondary | ICD-10-CM | POA: Diagnosis not present

## 2024-01-11 DIAGNOSIS — M4802 Spinal stenosis, cervical region: Secondary | ICD-10-CM | POA: Diagnosis not present

## 2024-01-11 DIAGNOSIS — M4712 Other spondylosis with myelopathy, cervical region: Secondary | ICD-10-CM | POA: Diagnosis not present

## 2024-01-11 DIAGNOSIS — M4803 Spinal stenosis, cervicothoracic region: Secondary | ICD-10-CM | POA: Diagnosis not present

## 2024-01-17 DIAGNOSIS — Z6826 Body mass index (BMI) 26.0-26.9, adult: Secondary | ICD-10-CM | POA: Diagnosis not present

## 2024-01-17 DIAGNOSIS — G959 Disease of spinal cord, unspecified: Secondary | ICD-10-CM | POA: Diagnosis not present

## 2024-01-17 DIAGNOSIS — M5412 Radiculopathy, cervical region: Secondary | ICD-10-CM | POA: Diagnosis not present

## 2024-01-20 ENCOUNTER — Other Ambulatory Visit: Payer: Self-pay | Admitting: Neurosurgery

## 2024-02-22 ENCOUNTER — Other Ambulatory Visit: Payer: Self-pay | Admitting: Internal Medicine

## 2024-02-29 ENCOUNTER — Encounter (HOSPITAL_COMMUNITY)
Admission: RE | Admit: 2024-02-29 | Discharge: 2024-02-29 | Disposition: A | Discharge status: Home / Self Care | Source: Ambulatory Visit | Attending: Neurosurgery | Admitting: Neurosurgery

## 2024-02-29 ENCOUNTER — Other Ambulatory Visit: Payer: Self-pay

## 2024-02-29 ENCOUNTER — Encounter (HOSPITAL_COMMUNITY): Payer: Self-pay

## 2024-02-29 VITALS — BP 150/98 | HR 69 | Temp 98.2°F | Resp 18 | Ht 69.0 in | Wt 185.8 lb

## 2024-02-29 DIAGNOSIS — N1832 Chronic kidney disease, stage 3b: Secondary | ICD-10-CM | POA: Insufficient documentation

## 2024-02-29 DIAGNOSIS — K219 Gastro-esophageal reflux disease without esophagitis: Secondary | ICD-10-CM | POA: Insufficient documentation

## 2024-02-29 DIAGNOSIS — Z01812 Encounter for preprocedural laboratory examination: Secondary | ICD-10-CM | POA: Insufficient documentation

## 2024-02-29 DIAGNOSIS — M4803 Spinal stenosis, cervicothoracic region: Secondary | ICD-10-CM | POA: Diagnosis not present

## 2024-02-29 DIAGNOSIS — I129 Hypertensive chronic kidney disease with stage 1 through stage 4 chronic kidney disease, or unspecified chronic kidney disease: Secondary | ICD-10-CM | POA: Insufficient documentation

## 2024-02-29 DIAGNOSIS — Z87891 Personal history of nicotine dependence: Secondary | ICD-10-CM | POA: Diagnosis not present

## 2024-02-29 DIAGNOSIS — E7800 Pure hypercholesterolemia, unspecified: Secondary | ICD-10-CM | POA: Diagnosis not present

## 2024-02-29 DIAGNOSIS — M4722 Other spondylosis with radiculopathy, cervical region: Secondary | ICD-10-CM | POA: Diagnosis not present

## 2024-02-29 DIAGNOSIS — Z85828 Personal history of other malignant neoplasm of skin: Secondary | ICD-10-CM | POA: Insufficient documentation

## 2024-02-29 DIAGNOSIS — Z01818 Encounter for other preprocedural examination: Secondary | ICD-10-CM

## 2024-02-29 DIAGNOSIS — Z8546 Personal history of malignant neoplasm of prostate: Secondary | ICD-10-CM | POA: Diagnosis not present

## 2024-02-29 DIAGNOSIS — Z981 Arthrodesis status: Secondary | ICD-10-CM | POA: Diagnosis not present

## 2024-02-29 LAB — BASIC METABOLIC PANEL WITH GFR
Anion gap: 12 (ref 5–15)
BUN: 33 mg/dL — ABNORMAL HIGH (ref 8–23)
CO2: 26 mmol/L (ref 22–32)
Calcium: 9.6 mg/dL (ref 8.9–10.3)
Chloride: 103 mmol/L (ref 98–111)
Creatinine, Ser: 1.93 mg/dL — ABNORMAL HIGH (ref 0.61–1.24)
GFR, Estimated: 35 mL/min — ABNORMAL LOW
Glucose, Bld: 101 mg/dL — ABNORMAL HIGH (ref 70–99)
Potassium: 3.7 mmol/L (ref 3.5–5.1)
Sodium: 141 mmol/L (ref 135–145)

## 2024-02-29 LAB — CBC
HCT: 48 % (ref 39.0–52.0)
Hemoglobin: 16.3 g/dL (ref 13.0–17.0)
MCH: 30.5 pg (ref 26.0–34.0)
MCHC: 34 g/dL (ref 30.0–36.0)
MCV: 89.9 fL (ref 80.0–100.0)
Platelets: 206 K/uL (ref 150–400)
RBC: 5.34 MIL/uL (ref 4.22–5.81)
RDW: 11.9 % (ref 11.5–15.5)
WBC: 8.7 K/uL (ref 4.0–10.5)
nRBC: 0 % (ref 0.0–0.2)

## 2024-02-29 LAB — SURGICAL PCR SCREEN
MRSA, PCR: NEGATIVE
Staphylococcus aureus: POSITIVE — AB

## 2024-02-29 NOTE — Progress Notes (Signed)
 Surgical Instructions   Your procedure is scheduled on January 28. Report to [facility] Main Entrance A at 6:00 A.M., then check in with the Admitting office. Any questions or running late day of surgery: call [phone]  Questions prior to your surgery date: call [phone], Monday-Friday, 8am-4pm. If you experience any cold or flu symptoms such as cough, fever, chills, shortness of breath, etc. between now and your scheduled surgery, please notify us  at the above number.     Remember:  Do not eat or drink anything after midnight the night before your surgery   Take these medicines the morning of surgery with A SIP OF WATER  amLODipine (NORVASC)  atorvastatin (LIPITOR)  pantoprazole  (PROTONIX )   May take these medicines IF NEEDED: Azelastine HCl (ASTEPRO ALLERGY NA)  fluticasone (FLONASE)   One week prior to surgery, STOP taking any Aspirin (unless otherwise instructed by your surgeon) Aleve, Naproxen, Ibuprofen, Motrin, Advil, Goody's, BC's, all herbal medications, fish oil, and non-prescription vitamins.                     Do NOT Smoke (Tobacco/Vaping) for 24 hours prior to your procedure.  If you use a CPAP at night, you may bring your mask/headgear for your overnight stay.   You will be asked to remove any contacts, glasses, piercing's, hearing aid's, dentures/partials prior to surgery. Please bring cases for these items if needed.    Your surgeon will determine if you are to be admitted or discharged the same day.  Patients discharged the day of surgery will not be allowed to drive home, and someone needs to stay with them for 24 hours.  SURGICAL WAITING ROOM VISITATION Patients may have no more than 2 support people in the waiting area - these visitors may rotate.   Pre-op nurse will coordinate an appropriate time for 2 ADULT support persons, who may not rotate, to accompany patient in pre-op.  Children under the age of 17 must have an adult with them who is not the  patient and must remain in the main waiting area with an adult.  If the patient needs to stay at the hospital during part of their recovery, the visitor guidelines for inpatient rooms apply.  Please refer to the [facility] website for the visitor guidelines for any additional information.   If you received a COVID test during your pre-op visit  it is requested that you wear a mask when out in public, stay away from anyone that may not be feeling well and notify your surgeon if you develop symptoms. If you have been in contact with anyone that has tested positive in the last 10 days please notify you surgeon.      Pre-operative 4 CHG Bathing Instructions   You can play a key role in reducing the risk of infection after surgery. Your skin needs to be as free of germs as possible. You can reduce the number of germs on your skin by washing with CHG (chlorhexidine gluconate) soap before surgery. CHG is an antiseptic soap that kills germs and continues to kill germs even after washing.   DO NOT use if you have an allergy to chlorhexidine/CHG or antibacterial soaps. If your skin becomes reddened or irritated, stop using the CHG and notify one of our RNs at [phone].   Please shower with the CHG soap starting 4 days before surgery using the following schedule:     Please keep in mind the following:  DO NOT shave,  including legs and underarms, starting the day of your first shower.   You may shave your face at any point before/day of surgery.  Place clean sheets on your bed the day you start using CHG soap. Use a clean washcloth (not used since being washed) for each shower. DO NOT sleep with pets once you start using the CHG.   CHG Shower Instructions:  Wash your face and private area with normal soap. If you choose to wash your hair, wash first with your normal shampoo.  After you use shampoo/soap, rinse your hair and body thoroughly to remove shampoo/soap residue.  Turn the water OFF  and apply  bottle of CHG soap to a CLEAN washcloth.  Apply CHG soap ONLY FROM YOUR NECK DOWN TO YOUR TOES (washing for 3-5 minutes)  DO NOT use CHG soap on face, private areas, open wounds, or sores.  Pay special attention to the area where your surgery is being performed.  If you are having back surgery, having someone wash your back for you may be helpful. Wait 2 minutes after CHG soap is applied, then you may rinse off the CHG soap.  Pat dry with a clean towel  Put on clean clothes/pajamas   If you choose to wear lotion, please use ONLY the CHG-compatible lotions that are listed below.  Additional instructions for the day of surgery:  If you choose, you may shower the morning of surgery with an antibacterial soap.  DO NOT APPLY any lotions, deodorants, cologne, or perfumes.   Do not bring valuables to the hospital. [facility] is not responsible for any belongings/valuables. Do not wear nail polish, gel polish, artificial nails, or any other type of covering on natural nails (fingers and toes) Do not wear jewelry or makeup Put on clean/comfortable clothes.  Please brush your teeth.  Ask your nurse before applying any prescription medications to the skin.     CHG Compatible Lotions   Aveeno Moisturizing lotion  Cetaphil Moisturizing Cream  Cetaphil Moisturizing Lotion  Clairol Herbal Essence Moisturizing Lotion, Dry Skin  Clairol Herbal Essence Moisturizing Lotion, Extra Dry Skin  Clairol Herbal Essence Moisturizing Lotion, Normal Skin  Curel Age Defying Therapeutic Moisturizing Lotion with Alpha Hydroxy  Curel Extreme Care Body Lotion  Curel Soothing Hands Moisturizing Hand Lotion  Curel Therapeutic Moisturizing Cream, Fragrance-Free  Curel Therapeutic Moisturizing Lotion, Fragrance-Free  Curel Therapeutic Moisturizing Lotion, Original Formula  Eucerin Daily Replenishing Lotion  Eucerin Dry Skin Therapy Plus Alpha Hydroxy Crme  Eucerin Dry Skin Therapy Plus Alpha Hydroxy  Lotion  Eucerin Original Crme  Eucerin Original Lotion  Eucerin Plus Crme Eucerin Plus Lotion  Eucerin TriLipid Replenishing Lotion  Keri Anti-Bacterial Hand Lotion  Keri Deep Conditioning Original Lotion Dry Skin Formula Softly Scented  Keri Deep Conditioning Original Lotion, Fragrance Free Sensitive Skin Formula  Keri Lotion Fast Absorbing Fragrance Free Sensitive Skin Formula  Keri Lotion Fast Absorbing Softly Scented Dry Skin Formula  Keri Original Lotion  Keri Skin Renewal Lotion Keri Silky Smooth Lotion  Keri Silky Smooth Sensitive Skin Lotion  Nivea Body Creamy Conditioning Oil  Nivea Body Extra Enriched Lotion  Nivea Body Original Lotion  Nivea Body Sheer Moisturizing Lotion Nivea Crme  Nivea Skin Firming Lotion  NutraDerm 30 Skin Lotion  NutraDerm Skin Lotion  NutraDerm Therapeutic Skin Cream  NutraDerm Therapeutic Skin Lotion  ProShield Protective Hand Cream  Provon moisturizing lotion  Please read over the following fact sheets that you were given.

## 2024-02-29 NOTE — Progress Notes (Signed)
 PCP - Alm Seabron COME Cardiologist - denies Nephrologist - Sherwood Azalea COME- [facility]  PPM/ICD - denies Device Orders -  Rep Notified -   Chest x-ray - denies EKG - 11/23/13/25  ce- requested  Stress Test - denies ECHO - denies Cardiac Cath - denies  Sleep Study - denies CPAP -   Fasting Blood Sugar - na Checks Blood Sugar _____ times a day  Last dose of GLP1 agonist- na  GLP1 instructions:   Blood Thinner Instructions:na Aspirin Instructions:Pt stated he was instructed by Dr.Pool to stop Aspirin one week prior to surgery. He states his last dose was 02/28/24.  ERAS Protcol -NPO PRE-SURGERY Ensure or G2-   COVID TEST- na   Anesthesia review: yes- Cr. 1.93  Patient denies shortness of breath, fever, cough and chest pain at PAT appointment   All instructions explained to the patient, with a verbal understanding of the material. Patient agrees to go over the instructions while at home for a better understanding. The opportunity to ask questions was provided.

## 2024-03-01 ENCOUNTER — Other Ambulatory Visit: Payer: Self-pay | Admitting: Internal Medicine

## 2024-03-01 NOTE — Anesthesia Preprocedure Evaluation (Addendum)
"                                    Anesthesia Evaluation  Patient identified by MRN, date of birth, ID band Patient awake    Reviewed: Allergy & Precautions, NPO status , Patient's Chart, lab work & pertinent test results, reviewed documented beta blocker date and time   History of Anesthesia Complications Negative for: history of anesthetic complications  Airway Mallampati: III  TM Distance: >3 FB Neck ROM: Limited    Dental no notable dental hx.    Pulmonary neg COPD, former smoker   breath sounds clear to auscultation       Cardiovascular hypertension, (-) CAD, (-) Past MI and (-) Cardiac Stents  Rhythm:Regular Rate:Normal     Neuro/Psych neg Seizures IMAGES: MRI c-spine 01/11/2024 ([facility]): IMPRESSION:  1.  Cervical spondylosis most significant at C6-7 with grade 1 anterolisthesis with disc osteophyte causing severe canal stenosis and severe neuroforaminal narrowing.  2.  Moderate to severe canal stenosis is present at C7-T1 and moderate canal stenosis at C2-3.  3.  ACDF is solid between C3-C6 with osteophyte formation causing mild residual central canal stenosis.      GI/Hepatic ,GERD  ,,(+) neg Cirrhosis        Endo/Other    Renal/GU CRFRenal disease     Musculoskeletal   Abdominal   Peds  Hematology   Anesthesia Other Findings   Reproductive/Obstetrics                              Anesthesia Physical Anesthesia Plan  ASA: 2  Anesthesia Plan: General   Post-op Pain Management:    Induction: Intravenous  PONV Risk Score and Plan: 2 and Ondansetron  and Dexamethasone   Airway Management Planned: Oral ETT and Video Laryngoscope Planned  Additional Equipment:   Intra-op Plan:   Post-operative Plan: Extubation in OR  Informed Consent: I have reviewed the patients History and Physical, chart, labs and discussed the procedure including the risks, benefits and alternatives for the proposed anesthesia with  the patient or authorized representative who has indicated his/her understanding and acceptance.     Dental advisory given  Plan Discussed with: CRNA  Anesthesia Plan Comments: (PAT note written 03/01/2024 by Allison Zelenak, PA-C.  )         Anesthesia Quick Evaluation  "

## 2024-03-01 NOTE — Progress Notes (Signed)
 Anesthesia Chart Review:  Case: [id number] Date/Time: 03/08/24 0745   Procedure: ANTERIOR CERVICAL DECOMPRESSION/DISCECTOMY FUSION 1 LEVEL - ACDF - C6-C7   Anesthesia type: General   Diagnosis: Radiculopathy, cervical region [M54.12]   Pre-op diagnosis: Radiculopathy cervical region   Location: [facility] ROOM 20 / [facility]   Surgeons: Louis Shove, MD       DISCUSSION: Patient is a Kurt Hall scheduled for the above procedure.   History includes former smoker, HTN, hypercholesterolemia, prostate cancer (s/p radical prostatectomy 01/06/2011, salvage radiation 2014), GERD, CKD, skin cancer (BCC), spinal surgery, macular degeneration (s/p surgery), SBO (s/p LOA).   Last nephrology follow-up was on 10/28/2023.  Has been followed for primarily CKD stage IIIb.  eGFR ~ 27-34 and Cr ~ 2.0-24. CKD attributed to vascular/hypertensive disease.  Previous imaging suggested benign renal cyst.  Urology following prostate cancer.  Overall CKD and BP felt stable.  No changes made. 75-month follow-up planned.  02/29/2024 creatinine 1.96 with eGFR 35.  He denied chest pain and SOB at PAT. 11/24/2023 EKG showed SR, non-specific ST-T changes.   He reported instructions to hold aspirin for 1 week prior to surgery, last dose 02/28/2024.    Anesthesia team to evaluate on the day of surgery.   VS: BP (!) 150/98   Pulse 69   Temp 36.8 C   Resp 18   Ht 5' 9 (1.753 m)   Wt 84.3 kg   SpO2 98%   BMI 27.44 kg/m    PROVIDERS: Seabron Lenis, MD is PCP  Azalea Beagle, MD is nephrologist ([facility]) Albertus Heinz, MD is GI Barthel, Leonor, NP is urology provider MARYELIZABETH)   LABS: Labs reviewed: Acceptable for surgery. Cr 1.93, consistent with history and prior results. (all labs ordered are listed, but only abnormal results are displayed)  Labs Reviewed  SURGICAL PCR SCREEN - Abnormal; Notable for the following components:      Result Value   Staphylococcus aureus POSITIVE (*)    All other components within normal  limits  BASIC METABOLIC PANEL WITH GFR - Abnormal; Notable for the following components:   Glucose, Bld 101 (*)    BUN 33 (*)    Creatinine, Ser 1.93 (*)    GFR, Estimated 35 (*)    All other components within normal limits  CBC     IMAGES: MRI c-spine 01/11/2024 ([facility]): IMPRESSION:  1.  Cervical spondylosis most significant at C6-7 with grade 1 anterolisthesis with disc osteophyte causing severe canal stenosis and severe neuroforaminal narrowing.  2.  Moderate to severe canal stenosis is present at C7-T1 and moderate canal stenosis at C2-3.  3.  ACDF is solid between C3-C6 with osteophyte formation causing mild residual central canal stenosis.   MRI Abd 11/06/2022: IMPRESSION: 1. No MR findings of the abdomen to explain weight loss or epigastric pain. 2. Very tiny subcentimeter cystic lesions scattered throughout the pancreas, not obviously changed. As there is no observed increased risk of malignancy for such lesions smaller than 2 cm, especially given initially established imaging stability, no further follow-up or characterization is required. 3. Hepatic parenchymal signal findings suggesting hepatic iron deposition.   EKG: EKG 11/24/2023 ([facility]): Tracing scanned under Media tab. Normal sinus rhythm  Nonspecific ST T wave changes  Borderline ECG  Kurt Hall ([id number]) on 11/24/2023 9:16:48 PM certifies that he/she has reviewed the ECG tracing and confirms the independent interpretation is correct.   CV: N/A  Past Medical History:  Diagnosis Date   Allergic rhinitis  Allergy    Cataract    Chronic kidney disease    Colon polyps    Dactylitis    GERD (gastroesophageal reflux disease)    Hemorrhoids    Hx of basal cell carcinoma    Hx of herpes genitalis    Hypercholesterolemia    Hypertension    Internal hemorrhoids    Prostate cancer (HCC)    followed by [facility]   Tubular adenoma of colon     Past Surgical History:  Procedure Laterality Date    APPENDECTOMY     C-spine surgery W/ fusion of cervical discs  2003   CATARACT EXTRACTION Left    colonoscopy     compound fracture of left leg     macular degeneration eye surgery Left    PROSTATE SURGERY     SMALL INTESTINE SURGERY     blockage     MEDICATIONS:  aspirin 81 MG tablet   acetaminophen (TYLENOL) 500 MG tablet   amLODipine (NORVASC) 10 MG tablet   atorvastatin (LIPITOR) 20 MG tablet   Azelastine HCl (ASTEPRO ALLERGY NA)   bisacodyl 5 MG EC tablet   clonazePAM (KLONOPIN) 0.5 MG tablet   cyanocobalamin  (VITAMIN B12) 1000 MCG tablet   docusate sodium (DULCOLAX PINK STOOL SOFTENER) 100 MG capsule   fluticasone (FLONASE) 50 MCG/ACT nasal spray   hydrochlorothiazide (HYDRODIURIL) 12.5 MG tablet   pantoprazole  (PROTONIX ) 40 MG tablet   Polyethyl Glycol-Propyl Glycol (LUBRICANT EYE DROPS) 0.4-0.3 % SOLN   Potassium 99 MG TABS   sucralfate  (CARAFATE ) 1 g tablet   telmisartan (MICARDIS) 80 MG tablet   No current facility-administered medications for this encounter.    Isaiah Ruder, PA-C Surgical Short Stay/Anesthesiology [facility] Phone [phone] [facility] Phone [phone] 03/01/2024 3:30 PM

## 2024-03-13 NOTE — H&P (View-Only) (Signed)
 Contacted patient and updated with new surgery date of 03/15/2024, arrive at 0630, NPO. No other questions at this time.

## 2024-03-13 NOTE — Progress Notes (Signed)
 Contacted patient and updated with new surgery date of 03/15/2024, arrive at 0630, NPO. No other questions at this time.

## 2024-03-15 ENCOUNTER — Encounter (HOSPITAL_COMMUNITY): Admission: RE | Disposition: A | Payer: Self-pay | Source: Home / Self Care | Attending: Neurosurgery

## 2024-03-15 ENCOUNTER — Ambulatory Visit (HOSPITAL_COMMUNITY): Payer: Self-pay | Admitting: Vascular Surgery

## 2024-03-15 ENCOUNTER — Observation Stay (HOSPITAL_COMMUNITY)
Admission: RE | Admit: 2024-03-15 | Discharge: 2024-03-16 | Disposition: A | Source: Home / Self Care | Attending: Neurosurgery | Admitting: Neurosurgery

## 2024-03-15 ENCOUNTER — Ambulatory Visit (HOSPITAL_COMMUNITY)

## 2024-03-15 ENCOUNTER — Encounter (HOSPITAL_COMMUNITY): Payer: Self-pay | Admitting: Neurosurgery

## 2024-03-15 ENCOUNTER — Encounter (HOSPITAL_COMMUNITY): Payer: Self-pay

## 2024-03-15 DIAGNOSIS — M4712 Other spondylosis with myelopathy, cervical region: Principal | ICD-10-CM | POA: Diagnosis present

## 2024-03-15 MED ORDER — SODIUM CHLORIDE 0.9 % IV SOLN: 250.0000 mL | INTRAVENOUS | Status: DC

## 2024-03-15 MED ORDER — EPHEDRINE SULFATE-NACL 50-0.9 MG/10ML-% IV SOSY
PREFILLED_SYRINGE | INTRAVENOUS | Status: DC | PRN
  Administered 2024-03-15: 5 mg via INTRAVENOUS

## 2024-03-15 MED ORDER — 0.9 % SODIUM CHLORIDE (POUR BTL) OPTIME
TOPICAL | Status: DC | PRN
  Administered 2024-03-15: 1000 mL

## 2024-03-15 MED ORDER — ASPIRIN 81 MG PO TBEC
81.0000 mg | DELAYED_RELEASE_TABLET | Freq: Every morning | ORAL | Status: DC
  Administered 2024-03-16: 81 mg via ORAL
  Filled 2024-03-15: qty 1

## 2024-03-15 MED ORDER — CHLORHEXIDINE GLUCONATE 4 % EX SOLN: 1.0000 | CUTANEOUS | 1 refills | Status: AC

## 2024-03-15 MED ORDER — ROCURONIUM BROMIDE 10 MG/ML (PF) SYRINGE
PREFILLED_SYRINGE | INTRAVENOUS | Status: AC
  Filled 2024-03-15: qty 10

## 2024-03-15 MED ORDER — ONDANSETRON HCL 4 MG/2ML IJ SOLN
INTRAMUSCULAR | Status: DC | PRN
  Administered 2024-03-15: 4 mg via INTRAVENOUS

## 2024-03-15 MED ORDER — CEFAZOLIN SODIUM-DEXTROSE 1-4 GM/50ML-% IV SOLN
1.0000 g | Freq: Three times a day (TID) | INTRAVENOUS | Status: AC
  Administered 2024-03-15 (×2): 1 g via INTRAVENOUS
  Filled 2024-03-15 (×2): qty 50

## 2024-03-15 MED ORDER — VITAMIN B-12 1000 MCG PO TABS
1000.0000 ug | ORAL_TABLET | Freq: Every morning | ORAL | Status: DC
  Administered 2024-03-16: 1000 ug via ORAL
  Filled 2024-03-15: qty 1

## 2024-03-15 MED ORDER — MIDAZOLAM HCL 2 MG/2ML IJ SOLN
INTRAMUSCULAR | Status: AC
  Filled 2024-03-15: qty 2

## 2024-03-15 MED ORDER — SUGAMMADEX SODIUM 200 MG/2ML IV SOLN
INTRAVENOUS | Status: DC | PRN
  Administered 2024-03-15: 200 mg via INTRAVENOUS

## 2024-03-15 MED ORDER — HYDROMORPHONE HCL 1 MG/ML IJ SOLN: 1.0000 mg | INTRAMUSCULAR | Status: DC | PRN

## 2024-03-15 MED ORDER — CHLORHEXIDINE GLUCONATE CLOTH 2 % EX PADS: 6.0000 | MEDICATED_PAD | Freq: Once | CUTANEOUS | Status: DC

## 2024-03-15 MED ORDER — ONDANSETRON HCL 4 MG/2ML IJ SOLN: 4.0000 mg | Freq: Four times a day (QID) | INTRAMUSCULAR | Status: DC | PRN

## 2024-03-15 MED ORDER — SODIUM CHLORIDE 0.9% FLUSH: 3.0000 mL | INTRAVENOUS | Status: DC | PRN

## 2024-03-15 MED ORDER — FENTANYL CITRATE (PF) 100 MCG/2ML IJ SOLN
INTRAMUSCULAR | Status: AC
  Filled 2024-03-15: qty 2

## 2024-03-15 MED ORDER — ATORVASTATIN CALCIUM 10 MG PO TABS
20.0000 mg | ORAL_TABLET | Freq: Every morning | ORAL | Status: DC
  Administered 2024-03-16: 20 mg via ORAL
  Filled 2024-03-15: qty 2

## 2024-03-15 MED ORDER — ACETAMINOPHEN 325 MG PO TABS
650.0000 mg | ORAL_TABLET | ORAL | Status: DC | PRN
  Administered 2024-03-15 – 2024-03-16 (×3): 650 mg via ORAL
  Filled 2024-03-15 (×3): qty 2

## 2024-03-15 MED ORDER — THROMBIN (RECOMBINANT) 5000 UNITS EX SOLR
CUTANEOUS | Status: DC | PRN
  Administered 2024-03-15: 10 mL via TOPICAL

## 2024-03-15 MED ORDER — HYDROCODONE-ACETAMINOPHEN 5-325 MG PO TABS: 1.0000 | ORAL_TABLET | ORAL | Status: DC | PRN

## 2024-03-15 MED ORDER — FENTANYL CITRATE (PF) 250 MCG/5ML IJ SOLN
INTRAMUSCULAR | Status: DC | PRN
  Administered 2024-03-15: 100 ug via INTRAVENOUS

## 2024-03-15 MED ORDER — SODIUM CHLORIDE 0.9% FLUSH
3.0000 mL | Freq: Two times a day (BID) | INTRAVENOUS | Status: DC
  Administered 2024-03-15: 3 mL via INTRAVENOUS

## 2024-03-15 MED ORDER — LACTATED RINGERS IV SOLN: INTRAVENOUS | Status: DC

## 2024-03-15 MED ORDER — PANTOPRAZOLE SODIUM 40 MG PO TBEC
40.0000 mg | DELAYED_RELEASE_TABLET | Freq: Two times a day (BID) | ORAL | Status: DC
  Administered 2024-03-15: 40 mg via ORAL
  Filled 2024-03-15: qty 1

## 2024-03-15 MED ORDER — POLYVINYL ALCOHOL 1.4 % OP SOLN: 1.0000 [drp] | Freq: Three times a day (TID) | OPHTHALMIC | Status: DC | PRN

## 2024-03-15 MED ORDER — FENTANYL CITRATE (PF) 100 MCG/2ML IJ SOLN: 25.0000 ug | INTRAMUSCULAR | Status: DC | PRN

## 2024-03-15 MED ORDER — HYDROMORPHONE HCL 1 MG/ML IJ SOLN
INTRAMUSCULAR | Status: DC | PRN
  Administered 2024-03-15: .5 mg via INTRAVENOUS

## 2024-03-15 MED ORDER — ONDANSETRON HCL 4 MG/2ML IJ SOLN: 4.0000 mg | Freq: Once | INTRAMUSCULAR | Status: DC | PRN

## 2024-03-15 MED ORDER — PROPOFOL 10 MG/ML IV BOLUS
INTRAVENOUS | Status: AC
  Filled 2024-03-15: qty 20

## 2024-03-15 MED ORDER — CHLORHEXIDINE GLUCONATE 0.12 % MT SOLN
15.0000 mL | Freq: Once | OROMUCOSAL | Status: AC
  Administered 2024-03-15: 15 mL via OROMUCOSAL
  Filled 2024-03-15: qty 15

## 2024-03-15 MED ORDER — ROCURONIUM BROMIDE 10 MG/ML (PF) SYRINGE
PREFILLED_SYRINGE | INTRAVENOUS | Status: DC | PRN
  Administered 2024-03-15: 5 mg via INTRAVENOUS
  Administered 2024-03-15: 70 mg via INTRAVENOUS
  Administered 2024-03-15: 10 mg via INTRAVENOUS

## 2024-03-15 MED ORDER — DOCUSATE SODIUM 100 MG PO CAPS: 100.0000 mg | ORAL_CAPSULE | Freq: Two times a day (BID) | ORAL | Status: DC | PRN

## 2024-03-15 MED ORDER — ONDANSETRON HCL 4 MG PO TABS: 4.0000 mg | ORAL_TABLET | Freq: Four times a day (QID) | ORAL | Status: DC | PRN

## 2024-03-15 MED ORDER — CEFAZOLIN SODIUM-DEXTROSE 2-4 GM/100ML-% IV SOLN
2.0000 g | INTRAVENOUS | Status: AC
  Administered 2024-03-15: 2 g via INTRAVENOUS
  Filled 2024-03-15: qty 100

## 2024-03-15 MED ORDER — FLUTICASONE PROPIONATE 50 MCG/ACT NA SUSP: 1.0000 | Freq: Two times a day (BID) | NASAL | Status: DC | PRN

## 2024-03-15 MED ORDER — OXYCODONE HCL 5 MG PO TABS: 5.0000 mg | ORAL_TABLET | Freq: Once | ORAL | Status: DC | PRN

## 2024-03-15 MED ORDER — LIDOCAINE 2% (20 MG/ML) 5 ML SYRINGE
INTRAMUSCULAR | Status: AC
  Filled 2024-03-15: qty 5

## 2024-03-15 MED ORDER — ORAL CARE MOUTH RINSE: 15.0000 mL | Freq: Once | OROMUCOSAL | Status: AC

## 2024-03-15 MED ORDER — LIDOCAINE 2% (20 MG/ML) 5 ML SYRINGE
INTRAMUSCULAR | Status: DC | PRN
  Administered 2024-03-15: 100 mg via INTRAVENOUS

## 2024-03-15 MED ORDER — DEXAMETHASONE SOD PHOSPHATE PF 10 MG/ML IJ SOLN
INTRAMUSCULAR | Status: AC
  Filled 2024-03-15: qty 1

## 2024-03-15 MED ORDER — MENTHOL 3 MG MT LOZG
1.0000 | LOZENGE | OROMUCOSAL | Status: DC | PRN
  Administered 2024-03-15: 3 mg via ORAL
  Filled 2024-03-15: qty 9

## 2024-03-15 MED ORDER — OXYCODONE HCL 5 MG/5ML PO SOLN: 5.0000 mg | Freq: Once | ORAL | Status: DC | PRN

## 2024-03-15 MED ORDER — POLYETHYL GLYCOL-PROPYL GLYCOL 0.4-0.3 % OP SOLN: 1.0000 [drp] | Freq: Three times a day (TID) | OPHTHALMIC | Status: DC | PRN

## 2024-03-15 MED ORDER — ONDANSETRON HCL 4 MG/2ML IJ SOLN
INTRAMUSCULAR | Status: AC
  Filled 2024-03-15: qty 2

## 2024-03-15 MED ORDER — THROMBIN 5000 UNITS EX KIT
PACK | CUTANEOUS | Status: AC
  Filled 2024-03-15: qty 3

## 2024-03-15 MED ORDER — AZELASTINE HCL 0.1 % NA SOLN: 1.0000 | Freq: Two times a day (BID) | NASAL | Status: DC | PRN

## 2024-03-15 MED ORDER — CYCLOBENZAPRINE HCL 10 MG PO TABS: 10.0000 mg | ORAL_TABLET | Freq: Three times a day (TID) | ORAL | Status: DC | PRN

## 2024-03-15 MED ORDER — HYDROMORPHONE HCL 1 MG/ML IJ SOLN
INTRAMUSCULAR | Status: AC
  Filled 2024-03-15: qty 0.5

## 2024-03-15 MED ORDER — POTASSIUM 99 MG PO TABS: 99.0000 mg | ORAL_TABLET | Freq: Every morning | ORAL | Status: DC

## 2024-03-15 MED ORDER — HYDROCHLOROTHIAZIDE 12.5 MG PO TABS
12.5000 mg | ORAL_TABLET | Freq: Every morning | ORAL | Status: DC
  Administered 2024-03-15: 12.5 mg via ORAL
  Filled 2024-03-15: qty 1

## 2024-03-15 MED ORDER — PROPOFOL 10 MG/ML IV BOLUS
INTRAVENOUS | Status: DC | PRN
  Administered 2024-03-15: 100 mg via INTRAVENOUS

## 2024-03-15 MED ORDER — AMLODIPINE BESYLATE 10 MG PO TABS
10.0000 mg | ORAL_TABLET | Freq: Every morning | ORAL | Status: DC
  Administered 2024-03-16: 10 mg via ORAL
  Filled 2024-03-15: qty 1

## 2024-03-15 MED ORDER — IRBESARTAN 150 MG PO TABS
300.0000 mg | ORAL_TABLET | Freq: Every day | ORAL | Status: DC
  Administered 2024-03-15: 300 mg via ORAL
  Filled 2024-03-15: qty 2

## 2024-03-15 MED ORDER — HYDROCODONE-ACETAMINOPHEN 10-325 MG PO TABS: 2.0000 | ORAL_TABLET | ORAL | Status: DC | PRN

## 2024-03-15 MED ORDER — MUPIROCIN 2 % EX OINT: 1.0000 | TOPICAL_OINTMENT | Freq: Two times a day (BID) | CUTANEOUS | 0 refills | Status: AC

## 2024-03-15 MED ORDER — BISACODYL 5 MG PO TBEC
5.0000 mg | DELAYED_RELEASE_TABLET | ORAL | Status: DC
  Administered 2024-03-15: 5 mg via ORAL
  Filled 2024-03-15: qty 1

## 2024-03-15 MED ORDER — ACETAMINOPHEN 650 MG RE SUPP: 650.0000 mg | RECTAL | Status: DC | PRN

## 2024-03-15 MED ORDER — PHENOL 1.4 % MT LIQD: 1.0000 | OROMUCOSAL | Status: DC | PRN

## 2024-03-15 MED ORDER — CLONAZEPAM 0.5 MG PO TABS
0.5000 mg | ORAL_TABLET | Freq: Every day | ORAL | Status: DC
  Administered 2024-03-15: 0.5 mg via ORAL
  Filled 2024-03-15: qty 1

## 2024-03-15 MED ORDER — ACETAMINOPHEN 10 MG/ML IV SOLN: 1000.0000 mg | Freq: Once | INTRAVENOUS | Status: DC | PRN

## 2024-03-15 MED ORDER — DEXAMETHASONE SOD PHOSPHATE PF 10 MG/ML IJ SOLN
INTRAMUSCULAR | Status: DC | PRN
  Administered 2024-03-15: 8 mg via INTRAVENOUS

## 2024-03-15 NOTE — Progress Notes (Signed)
 Orthopedic Tech Progress Note Patient Details:  Kurt Hall 07/01/1945 [id number]  Ortho Devices Type of Ortho Device: Soft collar Ortho Device/Splint Location: NECK Ortho Device/Splint Interventions: Ordered, Other (comment)  PACU RN called requesting a SOFT COLLAR   Post Interventions Patient Tolerated: Well Instructions Provided: Care of device  [name] Pac 03/15/2024, 11:53 AM

## 2024-03-15 NOTE — H&P (Signed)
 " Renny Remer is an 79 y.o. male.   Chief Complaint: Weakness HPI: 79 year old male with past history of C3-C6 anterior cervical discectomy and fusion with good results presents now with increasing bilateral upper extremity weakness numbness and paresthesias with increasing gait instability.  Workup demonstrates evidence of stable and solid fusion from C3-C6 without evidence of complicating features.  Patient with significant adjacent segment degeneration at C6-7 with degenerative anterior listhesis and severe stenosis with early cord signal change and significant bilateral neuroforaminal stenosis.  Patient presents now for C6-7 anterior cervical discectomy and fusion in hopes improving his symptoms.  Past Medical History:  Diagnosis Date   Allergic rhinitis    Allergy    Cataract    Chronic kidney disease    Colon polyps    Dactylitis    GERD (gastroesophageal reflux disease)    Hemorrhoids    Hx of basal cell carcinoma    Hx of herpes genitalis    Hypercholesterolemia    Hypertension    Internal hemorrhoids    Prostate cancer (HCC)    followed by [facility]   Tubular adenoma of colon     Past Surgical History:  Procedure Laterality Date   APPENDECTOMY     C-spine surgery W/ fusion of cervical discs  2003   CATARACT EXTRACTION Left    colonoscopy     compound fracture of left leg     macular degeneration eye surgery Left    PROSTATE SURGERY     SMALL INTESTINE SURGERY     blockage     Family History  Problem Relation Age of Onset   Colon cancer Mother        dx'd age 57's   Hypertension Mother    CVA Father    Hypertension Father    Colon cancer Sister 33       passed away    Heart attack Brother    Diabetes Brother    Hypertension Brother    Esophageal cancer Neg Hx    Rectal cancer Neg Hx    Stomach cancer Neg Hx    Social History:  reports that he has quit smoking. His smoking use included cigarettes. He has never used smokeless tobacco. He reports current  alcohol  use. He reports that he does not use drugs.  Allergies: Allergies[1]  Medications Prior to Admission  Medication Sig Dispense Refill   acetaminophen  (TYLENOL ) 500 MG tablet Take 500 mg by mouth at bedtime.     amLODipine  (NORVASC ) 10 MG tablet Take 10 mg by mouth in the morning.     aspirin  81 MG tablet Take 81 mg by mouth in the morning.     atorvastatin  (LIPITOR) 20 MG tablet Take 20 mg by mouth in the morning.     Azelastine  HCl (ASTEPRO  ALLERGY NA) Place 1 spray into the nose See admin instructions. Place 1 spray into both nostrils (scheduled) at night, may repeat in the morning if needed     bisacodyl  5 MG EC tablet Take 5 mg by mouth every other day.     clonazePAM  (KLONOPIN ) 0.5 MG tablet Take 0.5 mg by mouth at bedtime.     cyanocobalamin  (VITAMIN B12) 1000 MCG tablet Take 1,000 mcg by mouth in the morning.     docusate sodium  (DULCOLAX PINK STOOL SOFTENER) 100 MG capsule Take 100-200 mg by mouth 2 (two) times daily as needed (constipation).     fluticasone  (FLONASE ) 50 MCG/ACT nasal spray Place 1 spray into both nostrils 2 (two)  times daily as needed for allergies.     hydrochlorothiazide  (HYDRODIURIL ) 12.5 MG tablet Take 12.5 mg by mouth every morning.     pantoprazole  (PROTONIX ) 40 MG tablet Take 1 tablet (40 mg total) by mouth 2 (two) times daily. 180 tablet 3   Polyethyl Glycol-Propyl Glycol (LUBRICANT EYE DROPS) 0.4-0.3 % SOLN Place 1-2 drops into both eyes 3 (three) times daily as needed (dry/irritated eyes.).     Potassium 99 MG TABS Take 99 mg by mouth in the morning.     sucralfate  (CARAFATE ) 1 g tablet TAKE 1 TABLET BY MOUTH 4 TIMES DAILY AS NEEDED *FOLLOW  UP  APPOINTMENT  REQUIRED  FOR  FURTHER  REFILLS* 120 tablet 0   telmisartan (MICARDIS) 80 MG tablet Take 80 mg by mouth in the morning.      No results found for this or any previous visit (from the past 48 hours). No results found.  Pertinent items noted in HPI and remainder of comprehensive ROS otherwise  negative.  Blood pressure (!) 160/87, pulse 72, temperature 98.4 F (36.9 C), temperature source Oral, resp. rate 17, height 5' 9 (1.753 m), weight 82.6 kg, SpO2 98%.  Patient is awake and alert.  He is oriented and appropriate.  Speech is fluent.  Judgment insight are intact.  Cranial nerve function normal bilateral.  Motor examination reveals 4/5 left-sided grip and intrinsic weakness.  On the right side he has 4+/5 grip and intrinsic weakness.  Lower extremity strength is normal.  Sensory examination with patchy distal sensory loss in both upper extremities.  Reflexes are's increased in both lower extremities.  Examination head ears eyes nose and throat is normal.  Chest and abdomen are benign.  Extremities free of major deformity. 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  Plan C6-7 anterior cervical segment fusion utilizing interbody cage, local side autograft, and anterior placed patient.  Risks and benefits been explained.  Patient wishes to proceed.  Braileigh Landenberger A Nancyann Cotterman 03/15/2024, 8:08 AM       [1] No Known Allergies  "

## 2024-03-15 NOTE — Brief Op Note (Signed)
 03/15/2024  8:30 AM  10:33 AM  PATIENT:  Kurt Hall  79 y.o. male  PRE-OPERATIVE DIAGNOSIS:  Radiculopathy cervical region  POST-OPERATIVE DIAGNOSIS:  Radiculopathy cervical region  PROCEDURE:  Procedures with comments: ANTERIOR CERVICAL DECOMPRESSION/DISCECTOMY FUSION CERVICAL SIX-SEVEN (N/A) - ACDF - C6-C7  SURGEON:  Surgeons and Role:    DEWAINE Louis Shove, MD - Primary  PHYSICIAN ASSISTANT:   ASSISTANTSBETHA Jennetta PIETY   ANESTHESIA:   general  EBL:  100 cc   BLOOD ADMINISTERED:none  DRAINS: none   LOCAL MEDICATIONS USED:  NONE  SPECIMEN:  No Specimen  DISPOSITION OF SPECIMEN:  N/A  COUNTS:  YES  TOURNIQUET:  * No tourniquets in log *  DICTATION: .Dragon Dictation  PLAN OF CARE: Admit for overnight observation  PATIENT DISPOSITION:  PACU - hemodynamically stable.   Delay start of Pharmacological VTE agent (>24hrs) due to surgical blood loss or risk of bleeding: yes

## 2024-03-15 NOTE — Op Note (Signed)
 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  Asst. Surgeon: Jennetta, NP  Assistant utilized for exposure, decompression, fusion, instrumentation, closure  Anesthesia: General  Indication: 79 year old male remotely status post C3-C6 anterior cervical discectomy and fusion with good results.  Now presents with worsening numbness and weakness in both upper extremities with increasing gait instability.  Workup demonstrates evidence of adjacent level degeneration with degenerative anterolisthesis and marked stenosis at C6-7 with early cord signal change.  Patient presents now for anterior cervical decompression and fusion at C6-7 in hopes of proving symptoms.  This will require reexploration of his prior fusion from C3-C6.  Operative note: After induction of anesthesia, patient positioned supine with neck side extended L placeholder traction.  Patient's anterior cervical region prepped draped sterilely.  Incision made overlying C6-7.  Dissection performed on the right.  Retractor placed.  Fluoroscopy used.  Levels confirmed.  Previously placed anterior instrumentation was dissected free.  This was very stuck and I did not think it would be easy to expose the whole plate.  I elected to cut the plate beneath the level of C5 and remove the inferior screws and C6.  A titanium metal cutting bit was then used to cut the pillars of the plate.  While removing the screws into the body of C6 the screws caused quite a bit of pullout and fracturing of  the anterior part of the body of C6.  Gelfoam was placed over this.  The posterior body and lateral bodies were all intact however and this should not be structurally significant.  Attention then placed to the C6-7 disc level.  Anterior osteophyte removed using Leksell rongeurs and Kerrison rongeurs.  The space entered.  Disc space cleaned of all disc material down to level the posterior annulus using various instruments.  Microscope brought into the field used throughout the remainder of the discectomy and remaining aspects of annulus and osteophytes removed using high-speed drill down to level the posterior logical limb.  Posterior logical is not elevated and resected in piecemeal fashion.  Underlying thecal sac was then defined.  Wide central decompression then performed undercutting the bodies of C6 and C7.  Decompression then proceeded each neural foramina.  Wide anterior foraminotomies performed along course exiting C7 nerve roots bilaterally.  At this point a very thorough decompression been achieved.  There was no evidence of injury to the thecal sac or nerve roots.  Wound was irrigated.  Gelfoam was placed topically for hemostasis then removed.  A 7 mm Med[employer]onic interbody peek cage was then impacted into place and recessed slightly from the anterior cortical margin.  Because of the fracturing of the anterior body of C6 screw placement options were limited.  I chose to put the plate more laterally off to the right side.  I got good purchase into both C6 and C7.  With 13 mm variable angle screws.  Locking screws irrigation both levels.  Final images reveal good position of the cages and the hardware at the proper level with normal alignment of spine.  Wound was irrigated 1 final time.  Hemostasis was assured.  Wounds then closed in  layers with Vicryl sutures.  Steri-Strips and sterile dressing were applied.  No apparent complications.  Patient tolerated the procedure well and he returns to the recovery room  postop.

## 2024-03-15 NOTE — Interval H&P Note (Signed)
 History and Physical Interval Note:  03/15/2024 8:07 AM  Kurt Hall  has presented today for surgery, with the diagnosis of Radiculopathy cervical region.  The various methods of treatment have been discussed with the patient and family. After consideration of risks, benefits and other options for treatment, the patient has consented to  Procedures with comments: ANTERIOR CERVICAL DECOMPRESSION/DISCECTOMY FUSION 1 LEVEL (N/A) - ACDF - C6-C7 as a surgical intervention.  The patient's history has been reviewed, patient examined, no change in status, stable for surgery.  I have reviewed the patient's chart and labs.  Questions were answered to the patient's satisfaction.     [facility] [employer] [name]

## 2024-03-15 NOTE — Anesthesia Postprocedure Evaluation (Signed)
"   Anesthesia Post Note  Patient: Kurt Hall  Procedure(s) Performed: ANTERIOR CERVICAL DECOMPRESSION/DISCECTOMY FUSION CERVICAL SIX-SEVEN     Patient location during evaluation: PACU Anesthesia Type: General Level of consciousness: awake and alert Pain management: pain level controlled Vital Signs Assessment: post-procedure vital signs reviewed and stable Respiratory status: spontaneous breathing, nonlabored ventilation, respiratory function stable and patient connected to nasal cannula oxygen Cardiovascular status: blood pressure returned to baseline and stable Postop Assessment: no apparent nausea or vomiting Anesthetic complications: no   There were no known notable events for this encounter.  Last Vitals:  Vitals:   03/15/24 1145 03/15/24 1207  BP: 131/80 120/87  Pulse: 74 77  Resp: 16 18  Temp: 36.5 C   SpO2: 95% 98%    Last Pain:  Vitals:   03/15/24 1145  TempSrc:   PainSc: 0-No pain                 [name] Cornea      "

## 2024-03-15 NOTE — Transfer of Care (Signed)
 Immediate Anesthesia Transfer of Care Note  Patient: Kurt Hall  Procedure(s) Performed: ANTERIOR CERVICAL DECOMPRESSION/DISCECTOMY FUSION CERVICAL SIX-SEVEN  Patient Location: PACU  Anesthesia Type:General  Level of Consciousness: awake and alert   Airway & Oxygen Therapy: Patient Spontanous Breathing and Patient connected to face mask oxygen  Post-op Assessment: Report given to RN and Post -op Vital signs reviewed and stable  Post vital signs: Reviewed and stable  Last Vitals:  Vitals Value Taken Time  BP 144/72 03/15/24 10:49  Temp    Pulse 75 03/15/24 10:55  Resp 17 03/15/24 10:55  SpO2 98 % 03/15/24 10:55  Vitals shown include unfiled device data.  Last Pain:  Vitals:   03/15/24 0719  TempSrc:   PainSc: 0-No pain         Complications: There were no known notable events for this encounter.

## 2024-03-15 NOTE — Anesthesia Procedure Notes (Addendum)
 Procedure Name: Intubation Date/Time: 03/15/2024 8:40 AM  Performed by: Deeann Eva BROCKS, CRNAPre-anesthesia Checklist: Patient identified, Emergency Drugs available, Suction available and Patient being monitored Patient Re-evaluated:Patient Re-evaluated prior to induction Oxygen Delivery Method: Circle System Utilized Preoxygenation: Pre-oxygenation with 100% oxygen Induction Type: IV induction Ventilation: Mask ventilation without difficulty Laryngoscope Size: Glidescope and 4 Grade View: Grade I Tube type: Oral Tube size: 7.5 mm Number of attempts: 1 Airway Equipment and Method: Stylet and Oral airway Placement Confirmation: positive ETCO2, breath sounds checked- equal and bilateral and ETT inserted through vocal cords under direct vision Secured at: 23 cm Tube secured with: Tape Dental Injury: Teeth and Oropharynx as per pre-operative assessment  Comments: Performed by NORVA

## 2024-03-16 MED ORDER — CYCLOBENZAPRINE HCL 10 MG PO TABS: 10.0000 mg | ORAL_TABLET | Freq: Three times a day (TID) | ORAL | 0 refills | Status: AC | PRN

## 2024-03-16 MED ORDER — HYDROCODONE-ACETAMINOPHEN 5-325 MG PO TABS: 1.0000 | ORAL_TABLET | ORAL | 0 refills | Status: AC | PRN

## 2024-03-16 NOTE — Discharge Instructions (Signed)

## 2024-03-16 NOTE — Evaluation (Signed)
 Occupational Therapy Evaluation Patient Details Name: Kurt Hall MRN: [id number] DOB: 23-Oct-1945 Today's Date: 03/16/2024   History of Present Illness   79 yo M s/p C6-7 anterior cervical discectomy with interbody fusion utilizing interbody peek cage, local harvested autograft, and anterior plate instrumentation.  PMH includes: HTN, Basal Cell CA     Clinical Impressions Patient admitted for the diagnosis above.  PTA he lives alone at home, and does have PRN assist from his children.  Son will be staying with him for the first week, so he will have any assist needed.  Recommend follow up with MD as prescribed, and he may benefit from outpatient OT once cleared.  No further OT needs in the acute setting.       If plan is discharge home, recommend the following:   Assist for transportation;A little help with bathing/dressing/bathroom;Assistance with cooking/housework     Functional Status Assessment   Patient has had a recent decline in their functional status and demonstrates the ability to make significant improvements in function in a reasonable and predictable amount of time.     Equipment Recommendations   None recommended by OT     Recommendations for Other Services         Precautions/Restrictions   Precautions Precautions: Cervical Precaution Booklet Issued: Yes (comment) Recall of Precautions/Restrictions: Intact Required Braces or Orthoses: Cervical Brace Cervical Brace: Soft collar;For comfort Restrictions Weight Bearing Restrictions Per Provider Order: No     Mobility Bed Mobility Overal bed mobility: Modified Independent                  Transfers Overall transfer level: Modified independent Equipment used: Rolling walker (2 wheels)                      Balance Overall balance assessment: Mild deficits observed, not formally tested                                         ADL either performed or assessed with  clinical judgement   ADL                       Lower Body Dressing: Minimal assistance;Sit to/from stand   Toilet Transfer: Modified Independent;Rolling walker (2 wheels);Ambulation                   Vision Patient Visual Report: No change from baseline       Perception Perception: Not tested       Praxis Praxis: Not tested       Pertinent Vitals/Pain Pain Assessment Pain Assessment: No/denies pain     Extremity/Trunk Assessment Upper Extremity Assessment Upper Extremity Assessment: Right hand dominant;LUE deficits/detail LUE Sensation: decreased light touch;decreased proprioception LUE Coordination: decreased gross motor;decreased fine motor   Lower Extremity Assessment Lower Extremity Assessment: Defer to PT evaluation   Cervical / Trunk Assessment Cervical / Trunk Assessment: Neck Surgery   Communication Communication Communication: No apparent difficulties   Cognition Arousal: Alert Behavior During Therapy: Impulsive Cognition: No apparent impairments                               Following commands: Intact       Cueing  General Comments   Cueing Techniques: Verbal cues   VSS   Exercises  Shoulder Instructions      Home Living Family/patient expects to be discharged to:: Private residence Living Arrangements: Alone Available Help at Discharge: Family;Available 24 hours/day;Available PRN/intermittently Type of Home: House Home Access: Level entry     Home Layout: One level     Bathroom Shower/Tub: Tub/shower unit;Walk-in shower   Bathroom Toilet: Standard Bathroom Accessibility: Yes How Accessible: Accessible via walker Home Equipment: Rolling Walker (2 wheels);Rollator (4 wheels)          Prior Functioning/Environment Prior Level of Function : Independent/Modified Independent;Driving                    OT Problem List: Impaired sensation;Decreased coordination;Decreased safety awareness    OT Treatment/Interventions:        OT Goals(Current goals can be found in the care plan section)   Acute Rehab OT Goals Patient Stated Goal: Return home OT Goal Formulation: With patient Time For Goal Achievement: 03/17/24 Potential to Achieve Goals: Good   OT Frequency:       Co-evaluation              AM-PAC OT 6 Clicks Daily Activity     Outcome Measure Help from another person eating meals?: A Little Help from another person taking care of personal grooming?: A Little Help from another person toileting, which includes using toliet, bedpan, or urinal?: None Help from another person bathing (including washing, rinsing, drying)?: A Little Help from another person to put on and taking off regular upper body clothing?: A Little Help from another person to put on and taking off regular lower body clothing?: A Little 6 Click Score: 19   End of Session Nurse Communication: Mobility status  Activity Tolerance: Patient tolerated treatment well Patient left: in bed;with family/visitor present;with call bell/phone within reach  OT Visit Diagnosis: Ataxia, unspecified (R27.0);Muscle weakness (generalized) (M62.81)                Time: 9049-8984 OT Time Calculation (min): 25 min Charges:  OT General Charges $OT Visit: 1 Visit OT Evaluation $OT Eval Moderate Complexity: 1 Mod OT Treatments $Self Care/Home Management : 8-22 mins  03/16/2024  RP, OTR/L  Acute Rehabilitation Services  Office:  [phone]   Charlie JONETTA Halsted 03/16/2024, 10:20 AM

## 2024-03-16 NOTE — Care Management Obs Status (Signed)
 MEDICARE OBSERVATION STATUS NOTIFICATION   Patient Details  Name: Ulises Wolfinger MRN: [id number] Date of Birth: [birth year]   Medicare Observation Status Notification Given:  Yes    Jennie Laneta Dragon 03/16/2024, 9:29 AM

## 2024-03-16 NOTE — Plan of Care (Signed)
 Pt doing well. Pt and sons given D/C instructions with verbal understanding. Rx's were sent to the pharmacy by MD. Pt's incision is clean and dry with no sign of infection. Pt's IV was removed prior to D/C. Pt D/C'd home via wheelchair per MD order. Pt is stable @ D/C and has  no other needs at this time. Rosina Rakers, RN

## 2024-03-16 NOTE — Discharge Summary (Signed)
 Physician Discharge Summary  Patient ID: Kurt Hall MRN: [id number] DOB/AGE: 09-08-1945 79 y.o.  Admit date: 03/15/2024 Discharge date: 03/16/2024  Admission Diagnoses:  Discharge Diagnoses:  Principal Problem:   Cervical spondylosis with myelopathy and radiculopathy   Discharged Condition: good  Hospital Course: Admitted to the hospital where he underwent uncomplicated C6-7 anterior cervical decompression and fusion.  Postoperatively doing well.  Preoperative numbness and weakness significantly improved.  Neck pain well-controlled.  Swallowing well.  Voice strong.  Ready for discharge home.  Consults:   Significant Diagnostic Studies:   Treatments:   Discharge Exam: Blood pressure (!) 155/83, pulse (!) 102, temperature 98.1 F (36.7 C), temperature source Oral, resp. rate 20, height 5' 9 (1.753 m), weight 82.6 kg, SpO2 100%.  Awake and alert.  Oriented and appropriate.  Motor and sensory function intact.  Wound clean and dry.  Neck supple.  Chest and abdomen benign. Disposition: Discharge disposition: 01-Home or Self Care        Allergies as of 03/16/2024   No Known Allergies      Medication List     TAKE these medications    acetaminophen  500 MG tablet Commonly known as: TYLENOL  Take 500 mg by mouth at bedtime.   amLODipine  10 MG tablet Commonly known as: NORVASC  Take 10 mg by mouth in the morning.   aspirin  81 MG tablet Take 81 mg by mouth in the morning.   ASTEPRO  ALLERGY NA Place 1 spray into the nose See admin instructions. Place 1 spray into both nostrils (scheduled) at night, may repeat in the morning if needed   atorvastatin  20 MG tablet Commonly known as: LIPITOR Take 20 mg by mouth in the morning.   bisacodyl  5 MG EC tablet Generic drug: bisacodyl  Take 5 mg by mouth every other day.   chlorhexidine  4 % external liquid Commonly known as: HIBICLENS  Apply 15 mLs (1 Application total) topically as directed for 30 doses. Use as directed daily  for 5 days every other week for 6 weeks.   clonazePAM  0.5 MG tablet Commonly known as: KLONOPIN  Take 0.5 mg by mouth at bedtime.   cyanocobalamin  1000 MCG tablet Commonly known as: VITAMIN B12 Take 1,000 mcg by mouth in the morning.   cyclobenzaprine  10 MG tablet Commonly known as: FLEXERIL  Take 1 tablet (10 mg total) by mouth 3 (three) times daily as needed for muscle spasms.   Dulcolax Pink Stool Softener 100 MG capsule Generic drug: docusate sodium  Take 100-200 mg by mouth 2 (two) times daily as needed (constipation).   fluticasone  50 MCG/ACT nasal spray Commonly known as: FLONASE  Place 1 spray into both nostrils 2 (two) times daily as needed for allergies.   hydrochlorothiazide  12.5 MG tablet Commonly known as: HYDRODIURIL  Take 12.5 mg by mouth every morning.   HYDROcodone -acetaminophen  5-325 MG tablet Commonly known as: NORCO/VICODIN Take 1 tablet by mouth every 4 (four) hours as needed for moderate pain (pain score 4-6) ((score 4 to 6)).   Lubricant Eye Drops 0.4-0.3 % Soln Generic drug: Polyethyl Glycol-Propyl Glycol Place 1-2 drops into both eyes 3 (three) times daily as needed (dry/irritated eyes.).   mupirocin  ointment 2 % Commonly known as: BACTROBAN  Place 1 Application into the nose 2 (two) times daily for 60 doses. Use as directed 2 times daily for 5 days every other week for 6 weeks.   pantoprazole  40 MG tablet Commonly known as: PROTONIX  Take 1 tablet (40 mg total) by mouth 2 (two) times daily.   Potassium 99 MG Tabs Take  99 mg by mouth in the morning.   sucralfate  1 g tablet Commonly known as: CARAFATE  TAKE 1 TABLET BY MOUTH 4 TIMES DAILY AS NEEDED *FOLLOW  UP  APPOINTMENT  REQUIRED  FOR  FURTHER  REFILLS*   telmisartan 80 MG tablet Commonly known as: MICARDIS Take 80 mg by mouth in the morning.         Signed: Victory LABOR Cauy Melody 03/16/2024, 10:18 AM

## 2024-03-17 ENCOUNTER — Emergency Department (HOSPITAL_COMMUNITY)

## 2024-03-17 ENCOUNTER — Inpatient Hospital Stay (HOSPITAL_COMMUNITY): Admission: EM | Admit: 2024-03-17 | Source: Home / Self Care

## 2024-03-17 ENCOUNTER — Inpatient Hospital Stay (HOSPITAL_COMMUNITY)

## 2024-03-17 ENCOUNTER — Other Ambulatory Visit: Payer: Self-pay

## 2024-03-17 DIAGNOSIS — I63511 Cerebral infarction due to unspecified occlusion or stenosis of right middle cerebral artery: Principal | ICD-10-CM

## 2024-03-17 DIAGNOSIS — E782 Mixed hyperlipidemia: Secondary | ICD-10-CM | POA: Diagnosis present

## 2024-03-17 DIAGNOSIS — N184 Chronic kidney disease, stage 4 (severe): Secondary | ICD-10-CM | POA: Diagnosis present

## 2024-03-17 DIAGNOSIS — I639 Cerebral infarction, unspecified: Secondary | ICD-10-CM

## 2024-03-17 LAB — COMPREHENSIVE METABOLIC PANEL WITH GFR
ALT: 7 U/L (ref 0–44)
AST: 24 U/L (ref 15–41)
Albumin: 3.7 g/dL (ref 3.5–5.0)
Alkaline Phosphatase: 56 U/L (ref 38–126)
Anion gap: 13 (ref 5–15)
BUN: 34 mg/dL — ABNORMAL HIGH (ref 8–23)
CO2: 22 mmol/L (ref 22–32)
Calcium: 8.7 mg/dL — ABNORMAL LOW (ref 8.9–10.3)
Chloride: 105 mmol/L (ref 98–111)
Creatinine, Ser: 1.87 mg/dL — ABNORMAL HIGH (ref 0.61–1.24)
GFR, Estimated: 36 mL/min — ABNORMAL LOW
Glucose, Bld: 170 mg/dL — ABNORMAL HIGH (ref 70–99)
Potassium: 4.1 mmol/L (ref 3.5–5.1)
Sodium: 140 mmol/L (ref 135–145)
Total Bilirubin: 0.6 mg/dL (ref 0.0–1.2)
Total Protein: 6.4 g/dL — ABNORMAL LOW (ref 6.5–8.1)

## 2024-03-17 LAB — CBC
HCT: 42.8 % (ref 39.0–52.0)
Hemoglobin: 14.7 g/dL (ref 13.0–17.0)
MCH: 31 pg (ref 26.0–34.0)
MCHC: 34.3 g/dL (ref 30.0–36.0)
MCV: 90.3 fL (ref 80.0–100.0)
Platelets: 238 10*3/uL (ref 150–400)
RBC: 4.74 MIL/uL (ref 4.22–5.81)
RDW: 11.8 % (ref 11.5–15.5)
WBC: 10.9 10*3/uL — ABNORMAL HIGH (ref 4.0–10.5)
nRBC: 0 % (ref 0.0–0.2)

## 2024-03-17 LAB — I-STAT CHEM 8, ED
BUN: 35 mg/dL — ABNORMAL HIGH (ref 8–23)
Calcium, Ion: 1.07 mmol/L — ABNORMAL LOW (ref 1.15–1.40)
Chloride: 104 mmol/L (ref 98–111)
Creatinine, Ser: 2 mg/dL — ABNORMAL HIGH (ref 0.61–1.24)
Glucose, Bld: 170 mg/dL — ABNORMAL HIGH (ref 70–99)
HCT: 43 % (ref 39.0–52.0)
Hemoglobin: 14.6 g/dL (ref 13.0–17.0)
Potassium: 3.9 mmol/L (ref 3.5–5.1)
Sodium: 141 mmol/L (ref 135–145)
TCO2: 23 mmol/L (ref 22–32)

## 2024-03-17 LAB — DIFFERENTIAL
Abs Immature Granulocytes: 0.09 10*3/uL — ABNORMAL HIGH (ref 0.00–0.07)
Basophils Absolute: 0.1 10*3/uL (ref 0.0–0.1)
Basophils Relative: 1 %
Eosinophils Absolute: 0.4 10*3/uL (ref 0.0–0.5)
Eosinophils Relative: 4 %
Immature Granulocytes: 1 %
Lymphocytes Relative: 15 %
Lymphs Abs: 1.7 10*3/uL (ref 0.7–4.0)
Monocytes Absolute: 1.6 10*3/uL — ABNORMAL HIGH (ref 0.1–1.0)
Monocytes Relative: 15 %
Neutro Abs: 7.1 10*3/uL (ref 1.7–7.7)
Neutrophils Relative %: 64 %

## 2024-03-17 LAB — PROTIME-INR
INR: 1.1 (ref 0.8–1.2)
Prothrombin Time: 14.5 s (ref 11.4–15.2)

## 2024-03-17 LAB — ETHANOL: Alcohol, Ethyl (B): <15 mg/dL

## 2024-03-17 LAB — HEMOGLOBIN A1C
Hgb A1c MFr Bld: 5.2 % (ref 4.8–5.6)
Mean Plasma Glucose: 102.54 mg/dL

## 2024-03-17 LAB — CBG MONITORING, ED: Glucose-Capillary: 181 mg/dL — ABNORMAL HIGH (ref 70–99)

## 2024-03-17 LAB — APTT: aPTT: 26 s (ref 24–36)

## 2024-03-17 MED ORDER — STROKE: EARLY STAGES OF RECOVERY BOOK: Freq: Once | Status: AC

## 2024-03-17 MED ORDER — ACETAMINOPHEN 160 MG/5ML PO SOLN: 650.0000 mg | ORAL | Status: AC | PRN

## 2024-03-17 MED ORDER — SODIUM CHLORIDE 0.9 % IV SOLN: INTRAVENOUS | Status: AC

## 2024-03-17 MED ORDER — ACETAMINOPHEN 325 MG PO TABS: 650.0000 mg | ORAL_TABLET | ORAL | Status: AC | PRN

## 2024-03-17 MED ORDER — HEPARIN (PORCINE) 25000 UT/250ML-% IV SOLN
1000.0000 [IU]/h | INTRAVENOUS | Status: AC
  Administered 2024-03-17: 1000 [IU]/h via INTRAVENOUS
  Filled 2024-03-17: qty 250

## 2024-03-17 MED ORDER — ACETAMINOPHEN 650 MG RE SUPP: 650.0000 mg | RECTAL | Status: AC | PRN

## 2024-03-17 MED ORDER — ORAL CARE MOUTH RINSE: 15.0000 mL | OROMUCOSAL | Status: AC | PRN

## 2024-03-17 MED ORDER — ATORVASTATIN CALCIUM 40 MG PO TABS: 20.0000 mg | ORAL_TABLET | Freq: Every morning | ORAL | Status: AC

## 2024-03-17 MED ORDER — PANTOPRAZOLE SODIUM 40 MG PO TBEC: 40.0000 mg | DELAYED_RELEASE_TABLET | Freq: Two times a day (BID) | ORAL | Status: AC

## 2024-03-17 MED ORDER — CHLORHEXIDINE GLUCONATE CLOTH 2 % EX PADS: 6.0000 | MEDICATED_PAD | Freq: Every day | CUTANEOUS | Status: AC

## 2024-03-17 MED ORDER — SODIUM CHLORIDE 0.9% FLUSH
3.0000 mL | Freq: Once | INTRAVENOUS | Status: AC
  Administered 2024-03-17: 3 mL via INTRAVENOUS

## 2024-03-17 MED ORDER — ASPIRIN 300 MG RE SUPP: 300.0000 mg | Freq: Every day | RECTAL | Status: DC

## 2024-03-17 MED ORDER — DOCUSATE SODIUM 100 MG PO CAPS: 100.0000 mg | ORAL_CAPSULE | Freq: Two times a day (BID) | ORAL | Status: AC | PRN

## 2024-03-17 MED ORDER — SENNOSIDES-DOCUSATE SODIUM 8.6-50 MG PO TABS: 1.0000 | ORAL_TABLET | Freq: Every evening | ORAL | Status: AC | PRN

## 2024-03-17 MED ORDER — IOHEXOL 350 MG/ML SOLN
75.0000 mL | Freq: Once | INTRAVENOUS | Status: AC | PRN
  Administered 2024-03-17: 75 mL via INTRAVENOUS

## 2024-03-17 NOTE — ED Notes (Signed)
 Family came out of room in panic and asking for RN to come to evaluate pt. Pt was having difficulty talking and choking on saliva. Pt was repositioned and sat upright while being suctioned. During this pt started having L sided eye twitching in which has stopped after this episode. L sided facial movement begins again when needing more suction and pt reports more difficulty swallowing. Airway is intact and after being suctioned pt is able to talk. EDP came to bedside to evaluate and ICU MD was also made aware. No new orders were added at this time, suction is ready at bedside.

## 2024-03-17 NOTE — Progress Notes (Cosign Needed)
 EEG complete - results pending

## 2024-03-17 NOTE — ED Notes (Signed)
 CCMD called.

## 2024-03-17 NOTE — H&P (Signed)
 NEUROLOGY H&P NOTE   Date of service: March 17, 2024 Patient Name: Kurt Hall MRN:  [id number] DOB:  Jul 09, 1945 Chief Complaint: Weakness and numbness  History of Present Illness  Kurt Hall is a 79 y.o. male with chief complaint of left sided weakness and numbness, dysarthria which started around 5:45 PM today.  Symptoms are moderate, course stable.  A code stroke was called on arrival in the ED, I called of the code after CT and CT angiogram.  Patient is denying any headache.  He had a C6-C7 fusion 2 days ago.   NIH stroke scale 8 Modified rankin score: 0-Completely asymptomatic and back to baseline post- stroke IV Thrombolysis: No TNK due to recent spinal surgery Thrombectomy: Yes or no (reason) no thrombectomy per discussion with Dr. Lester  ROS  Comprehensive ROS performed and pertinent positives documented in the HPI   Past History   Past Medical History:  Diagnosis Date   Allergic rhinitis    Allergy    Cataract    Chronic kidney disease    Colon polyps    Dactylitis    GERD (gastroesophageal reflux disease)    Hemorrhoids    Hx of basal cell carcinoma    Hx of herpes genitalis    Hypercholesterolemia    Hypertension    Internal hemorrhoids    Prostate cancer (HCC)    followed by [facility]   Tubular adenoma of colon    Past Surgical History:  Procedure Laterality Date   APPENDECTOMY     C-spine surgery W/ fusion of cervical discs  2003   CATARACT EXTRACTION Left    colonoscopy     compound fracture of left leg     macular degeneration eye surgery Left    PROSTATE SURGERY     SMALL INTESTINE SURGERY     blockage    Family History  Problem Relation Age of Onset   Colon cancer Mother        dx'd age 18's   Hypertension Mother    CVA Father    Hypertension Father    Colon cancer Sister 68       passed away    Heart attack Brother    Diabetes Brother    Hypertension Brother    Esophageal cancer Neg Hx    Rectal cancer Neg Hx    Stomach cancer  Neg Hx    Social History   Socioeconomic History   Marital status: Divorced    Spouse name: Not on file   Number of children: 2   Years of education: Not on file   Highest education level: Not on file  Occupational History   Occupation: Retired    Associate Professor: [employer]  Tobacco Use   Smoking status: Former    Types: Cigarettes   Smokeless tobacco: Never  Vaping Use   Vaping status: Never Used  Substance and Sexual Activity   Alcohol  use: Yes    Alcohol /week: 0.0 standard drinks of alcohol     Comment: socially   Drug use: No   Sexual activity: Not on file  Other Topics Concern   Not on file  Social History Narrative   Not on file   Social Drivers of Health   Tobacco Use: Medium Risk (03/15/2024)   Patient History    Smoking Tobacco Use: Former    Smokeless Tobacco Use: Never    Passive Exposure: Not on Actuary Strain: Not on file  Food Insecurity: No Food Insecurity (04/22/2023)  Received from [facility]   Epic    Within the past 12 months, you worried that your food would run out before you got the money to buy more.: Never true    Within the past 12 months, the food you bought just didn't last and you didn't have money to get more.: Never true  Transportation Needs: No Transportation Needs (04/22/2023)   Received from [facility] - Transportation    In the past 12 months, has lack of transportation kept you from medical appointments or from getting medications?: No    Lack of Transportation (Non-Medical): No  Physical Activity: Not on file  Stress: Not on file  Social Connections: Not on file  Depression (EYV7-0): Not on file  Alcohol  Screen: Not on file  Housing: Unknown (03/11/2023)   Received from [facility]   Epic    Unable to Pay for Housing in the Last Year: Not on file    Number of Times Moved in the Last Year: Not on file    At any time in the past 12 months, were you  homeless or living in a shelter (including now)?: No  Utilities: Not on file  Health Literacy: Not on file   Allergies[1]  Medications  Current Medications[2]   Vitals   Vitals:   03/17/24 1900 03/17/24 1929 03/17/24 1932  BP:   130/83  Pulse:   85  Resp:   18  SpO2:   100%  Weight: 85.4 kg 85.4 kg   Height:  5' 9 (1.753 m)      Body mass index is 27.8 kg/m.  Physical Exam   Constitutional: Appears well-developed and well-nourished.  Psych: Affect appropriate to situation.  Eyes: No scleral injection.  HENT: No OP obstruction.  Head: Normocephalic.  Cardiovascular: Normal rate and regular rhythm.  Respiratory: Effort normal, non-labored breathing.  GI: Soft.  No distension. There is no tenderness.  Skin: WDI.   Neurologic Examination   Patient is alert and oriented x 3.  Pupils are equal round and reactive to light.  Speech is mild dysarthria.  Language is fluent.  Face is L side weak.  Extraocular movements are normal.  Visual fields are full to confrontation.  Tone is normal.  Strength is 3/5 in L arm and leg  Sensation is abnormal to light touch, L extinction.  Labs   CBC:  Recent Labs  Lab 03/17/24 1910 03/17/24 1913  WBC 10.9*  --   NEUTROABS 7.1  --   HGB 14.7 14.6  HCT 42.8 43.0  MCV 90.3  --   PLT 238  --    Basic Metabolic Panel:  Lab Results  Component Value Date   NA 141 03/17/2024   K 3.9 03/17/2024   CO2 22 03/17/2024   GLUCOSE 170 (H) 03/17/2024   BUN 35 (H) 03/17/2024   CREATININE 2.00 (H) 03/17/2024   CALCIUM  8.7 (L) 03/17/2024   GFRNONAA 36 (L) 03/17/2024   Lipid Panel: No results found for: LDLCALC HgbA1c: No results found for: HGBA1C Urine Drug Screen: No results found for: LABOPIA, COCAINSCRNUR, LABBENZ, AMPHETMU, THCU, LABBARB  Alcohol  Level     Component Value Date/Time   [facility] <15 03/17/2024 1910   INR  Lab Results  Component Value Date   INR 1.1 03/17/2024   APTT  Lab Results  Component Value Date    APTT 26 03/17/2024      Assessment   [name] is a  79 y.o. male with a right parietal completed stroke noted on CT head today, with right common carotid occlusion and large burden of thrombus within the proximal right ICA and ECA.  Most likely the stroke is caused by embolism from the right ICA.  Patient is at high risk of deterioration with additional embolization from the same source.  Per discussion with Dr. Lester the patient may be a candidate for thrombectomy if he develops any intra cranial occlusion and his symptoms worsen and given high risk of embolization patient will benefit from IV heparin , the risk-benefit ratio favors this intervention despite some risk of hemorrhagic transformation of patient's stroke.  Primary Diagnosis:  Cerebral infarction, unspecified.   Recommendations  - Admit for workup of stroke - MRI of the brain routine - Echocardiogram routine - Neurochecks every hour -Low-dose heparin  IV per pharmacy consult - Lipid panel - Hemoglobin A1c - Will obtain stat CT and CT angiogram with any significant change in stroke scale, patient may be a candidate for thrombectomy if he develops intracranial occlusion. ______________________________________________________________________   Signed, Eugenie CHRISTELLA Levin, MD [facility]      [1] No Known Allergies [2]  Current Facility-Administered Medications:    [START ON 03/18/2024]  stroke: early stages of recovery book, , Does not apply, Once, Emmitt Matthews M, MD   0.9 %  sodium chloride  infusion, , Intravenous, Continuous, Dosha Broshears M, MD   acetaminophen  (TYLENOL ) tablet 650 mg, 650 mg, Oral, Q4H PRN **OR** acetaminophen  (TYLENOL ) 160 MG/5ML solution 650 mg, 650 mg, Per Tube, Q4H PRN **OR** acetaminophen  (TYLENOL ) suppository 650 mg, 650 mg, Rectal, Q4H PRN, Rochelle Nephew, Eugenie CHRISTELLA, MD   [START ON 03/18/2024] atorvastatin  (LIPITOR) tablet 20 mg, 20 mg, Oral, q AM, Nereida Schepp M, MD    docusate sodium  (COLACE) capsule 100-200 mg, 100-200 mg, Oral, BID PRN, Biagio Snelson M, MD   pantoprazole  (PROTONIX ) EC tablet 40 mg, 40 mg, Oral, BID, Zakiyah Diop M, MD   senna-docusate (Senokot-S) tablet 1 tablet, 1 tablet, Oral, QHS PRN, Jesi Jurgens, Eugenie CHRISTELLA, MD   sodium chloride  flush (NS) 0.9 % injection 3 mL, 3 mL, Intravenous, Once, Randol Simmonds, MD  Current Outpatient Medications:    acetaminophen  (TYLENOL ) 500 MG tablet, Take 500 mg by mouth at bedtime., Disp: , Rfl:    amLODipine  (NORVASC ) 10 MG tablet, Take 10 mg by mouth in the morning., Disp: , Rfl:    aspirin  81 MG tablet, Take 81 mg by mouth in the morning., Disp: , Rfl:    atorvastatin  (LIPITOR) 20 MG tablet, Take 20 mg by mouth in the morning., Disp: , Rfl:    Azelastine  HCl (ASTEPRO  ALLERGY NA), Place 1 spray into the nose See admin instructions. Place 1 spray into both nostrils (scheduled) at night, may repeat in the morning if needed, Disp: , Rfl:    bisacodyl  5 MG EC tablet, Take 5 mg by mouth every other day., Disp: , Rfl:    chlorhexidine  (HIBICLENS ) 4 % external liquid, Apply 15 mLs (1 Application total) topically as directed for 30 doses. Use as directed daily for 5 days every other week for 6 weeks., Disp: 946 mL, Rfl: 1   clonazePAM  (KLONOPIN ) 0.5 MG tablet, Take 0.5 mg by mouth at bedtime., Disp: , Rfl:    cyanocobalamin  (VITAMIN B12) 1000 MCG tablet, Take 1,000 mcg by mouth in the morning., Disp: , Rfl:    cyclobenzaprine  (FLEXERIL ) 10 MG tablet, Take 1 tablet (10 mg total) by mouth 3 (three) times daily as needed  for muscle spasms., Disp: 30 tablet, Rfl: 0   docusate sodium  (DULCOLAX PINK STOOL SOFTENER) 100 MG capsule, Take 100-200 mg by mouth 2 (two) times daily as needed (constipation)., Disp: , Rfl:    fluticasone  (FLONASE ) 50 MCG/ACT nasal spray, Place 1 spray into both nostrils 2 (two) times daily as needed for allergies., Disp: , Rfl:    hydrochlorothiazide  (HYDRODIURIL ) 12.5 MG tablet, Take 12.5 mg by  mouth every morning., Disp: , Rfl:    HYDROcodone -acetaminophen  (NORCO/VICODIN) 5-325 MG tablet, Take 1 tablet by mouth every 4 (four) hours as needed for moderate pain (pain score 4-6) ((score 4 to 6))., Disp: 30 tablet, Rfl: 0   mupirocin  ointment (BACTROBAN ) 2 %, Place 1 Application into the nose 2 (two) times daily for 60 doses. Use as directed 2 times daily for 5 days every other week for 6 weeks., Disp: 60 g, Rfl: 0   pantoprazole  (PROTONIX ) 40 MG tablet, Take 1 tablet (40 mg total) by mouth 2 (two) times daily., Disp: 180 tablet, Rfl: 3   Polyethyl Glycol-Propyl Glycol (LUBRICANT EYE DROPS) 0.4-0.3 % SOLN, Place 1-2 drops into both eyes 3 (three) times daily as needed (dry/irritated eyes.)., Disp: , Rfl:    Potassium 99 MG TABS, Take 99 mg by mouth in the morning., Disp: , Rfl:    sucralfate  (CARAFATE ) 1 g tablet, TAKE 1 TABLET BY MOUTH 4 TIMES DAILY AS NEEDED *FOLLOW  UP  APPOINTMENT  REQUIRED  FOR  FURTHER  REFILLS*, Disp: 120 tablet, Rfl: 0   telmisartan (MICARDIS) 80 MG tablet, Take 80 mg by mouth in the morning., Disp: , Rfl:

## 2024-03-17 NOTE — ED Provider Notes (Signed)
 " [place] [facility] Provider Note   CSN: [id number] Arrival date & time: 03/17/24  8147  An emergency department physician performed an initial assessment on this suspected stroke patient at 51.  Patient presents with: Code stroke  Kurt Hall is a 79 y.o. male.   HPI   Patient has a history of hypertension, acid reflux, prostate cancer, cervical spondylosis.  Patient had surgery on the fourth, and anterior cervical decompression with discectomy and fusion of C6-C7.  Patient presented to the ED today with acute onset of left sided weakness as well as some speech difficulty.  Sx started this evening around 5pm.   Family called EMS and he was activated as a code stroke.  Patient states he is having difficulty moving his left side.  He also feels like sound some difficulty swallowing.  Prior to Admission medications  Medication Sig Start Date End Date Taking? Authorizing Provider  acetaminophen  (TYLENOL ) 500 MG tablet Take 500 mg by mouth at bedtime.    [provider]  amLODipine  (NORVASC ) 10 MG tablet Take 10 mg by mouth in the morning.    [provider]  aspirin  81 MG tablet Take 81 mg by mouth in the morning.    [provider]  atorvastatin  (LIPITOR) 20 MG tablet Take 20 mg by mouth in the morning.    [provider]  Azelastine  HCl (ASTEPRO  ALLERGY NA) Place 1 spray into the nose See admin instructions. Place 1 spray into both nostrils (scheduled) at night, may repeat in the morning if needed    [provider]  bisacodyl  5 MG EC tablet Take 5 mg by mouth every other day.    [provider]  chlorhexidine  (HIBICLENS ) 4 % external liquid Apply 15 mLs (1 Application total) topically as directed for 30 doses. Use as directed daily for 5 days every other week for 6 weeks. 03/15/24   Louis Shove, MD  clonazePAM  (KLONOPIN ) 0.5 MG tablet Take 0.5 mg by mouth at bedtime.    [provider]   cyanocobalamin  (VITAMIN B12) 1000 MCG tablet Take 1,000 mcg by mouth in the morning.    [provider]  cyclobenzaprine  (FLEXERIL ) 10 MG tablet Take 1 tablet (10 mg total) by mouth 3 (three) times daily as needed for muscle spasms. 03/16/24   Louis Shove, MD  docusate sodium  (DULCOLAX PINK STOOL SOFTENER) 100 MG capsule Take 100-200 mg by mouth 2 (two) times daily as needed (constipation).    [provider]  fluticasone  (FLONASE ) 50 MCG/ACT nasal spray Place 1 spray into both nostrils 2 (two) times daily as needed for allergies.    [provider]  hydrochlorothiazide  (HYDRODIURIL ) 12.5 MG tablet Take 12.5 mg by mouth every morning. 10/24/20   [provider]  HYDROcodone -acetaminophen  (NORCO/VICODIN) 5-325 MG tablet Take 1 tablet by mouth every 4 (four) hours as needed for moderate pain (pain score 4-6) ((score 4 to 6)). 03/16/24   Louis Shove, MD  mupirocin  ointment (BACTROBAN ) 2 % Place 1 Application into the nose 2 (two) times daily for 60 doses. Use as directed 2 times daily for 5 days every other week for 6 weeks. 03/15/24 04/14/24  Louis Shove, MD  pantoprazole  (PROTONIX ) 40 MG tablet Take 1 tablet (40 mg total) by mouth 2 (two) times daily. 07/21/23   Pyrtle, Gordy HERO, MD  Polyethyl Glycol-Propyl Glycol (LUBRICANT EYE DROPS) 0.4-0.3 % SOLN Place 1-2 drops into both eyes 3 (three) times daily as needed (dry/irritated eyes.).  [provider]  Potassium 99 MG TABS Take 99 mg by mouth in the morning.    [provider]  sucralfate  (CARAFATE ) 1 g tablet TAKE 1 TABLET BY MOUTH 4 TIMES DAILY AS NEEDED *FOLLOW  UP  APPOINTMENT  REQUIRED  FOR  FURTHER  REFILLS* 03/01/24   Pyrtle, Gordy HERO, MD  telmisartan (MICARDIS) 80 MG tablet Take 80 mg by mouth in the morning.    [provider]    Allergies: Patient has no known allergies.    Review of Systems  Updated Vital Signs BP 130/83 (BP Location: Right Arm)   Pulse 85   Resp 18   Ht 1.753 m (5'  9)   Wt 85.4 kg   SpO2 100%   BMI 27.80 kg/m   Physical Exam Vitals and nursing note reviewed.  Constitutional:      Appearance: He is well-developed. He is not diaphoretic.  HENT:     Head: Normocephalic and atraumatic.     Right Ear: External ear normal.     Left Ear: External ear normal.     Mouth/Throat:     Comments: No edema noted, no difficulty handling his secretions Eyes:     General: No scleral icterus.       Right eye: No discharge.        Left eye: No discharge.     Conjunctiva/sclera: Conjunctivae normal.  Neck:     Trachea: No tracheal deviation.  Cardiovascular:     Rate and Rhythm: Normal rate and regular rhythm.  Pulmonary:     Effort: Pulmonary effort is normal. No respiratory distress.     Breath sounds: Normal breath sounds. No stridor. No wheezing or rales.     Comments: No stridor Abdominal:     General: Bowel sounds are normal. There is no distension.     Palpations: Abdomen is soft.     Tenderness: There is no abdominal tenderness. There is no guarding or rebound.  Musculoskeletal:        General: No tenderness or deformity.     Cervical back: Neck supple.  Skin:    General: Skin is warm and dry.     Findings: No rash.  Neurological:     Mental Status: He is alert.     Cranial Nerves: Cranial nerve deficit present. No dysarthria or facial asymmetry.     Sensory: Sensory deficit present.     Motor: Weakness present. No abnormal muscle tone or seizure activity.     Coordination: Coordination normal.     Comments: Dysarthria, left-sided weakness, left-sided neglect  Psychiatric:        Mood and Affect: Mood normal.     (all labs ordered are listed, but only abnormal results are displayed) Labs Reviewed  CBC - Abnormal; Notable for the following components:      Result Value   WBC 10.9 (*)    All other components within normal limits  DIFFERENTIAL - Abnormal; Notable for the following components:   Monocytes Absolute 1.6 (*)    Abs  Immature Granulocytes 0.09 (*)    All other components within normal limits  COMPREHENSIVE METABOLIC PANEL WITH GFR - Abnormal; Notable for the following components:   Glucose, Bld 170 (*)    BUN 34 (*)    Creatinine, Ser 1.87 (*)    Calcium  8.7 (*)    Total Protein 6.4 (*)    GFR, Estimated 36 (*)    All other components within normal limits  I-STAT  CHEM 8, ED - Abnormal; Notable for the following components:   BUN 35 (*)    Creatinine, Ser 2.00 (*)    Glucose, Bld 170 (*)    Calcium , Ion 1.07 (*)    All other components within normal limits  CBG MONITORING, ED - Abnormal; Notable for the following components:   Glucose-Capillary 181 (*)    All other components within normal limits  PROTIME-INR  APTT  ETHANOL    EKG: None  Radiology: CT ANGIO HEAD NECK W WO CM (CODE STROKE) Addendum Date: 03/17/2024 ** ADDENDUM #1 ** ADDENDUM: Findings discussed with Dr. Vinnie via telephone at 7:47 PM. ---------------------------------------------------- Electronically signed by: Glendia Molt MD 03/17/2024 07:54 PM EST RP Workstation: [id number]   Result Date: 03/17/2024 ** ORIGINAL REPORT ** EXAM: CTA HEAD AND NECK WITH AND WITHOUT 03/17/2024 07:13:06 PM TECHNIQUE: CTA of the head and neck was performed with and without the administration of intravenous contrast. Multiplanar 2D and/or 3D reformatted images are provided for review. Automated exposure control, iterative reconstruction, and/or weight based adjustment of the mA/kV was utilized to reduce the radiation dose to as low as reasonably achievable. Stenosis of the internal carotid arteries measured using NASCET criteria. 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 Aortic atherosclerosis. CERVICAL CAROTID ARTERIES: The right the common carotid artery is occluded  its origin and nonopacified throughout its course. 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 The more distal right ICA is patent but poorly opacified when compared to the left. CERVICAL VERTEBRAL ARTERIES: No dissection, arterial injury, or significant stenosis. LUNGS AND MEDIASTINUM: Unremarkable. SOFT TISSUES: Edema and gas in the  right neck, presumably related to a recent intervention. BONES: No acute abnormality. ANTERIOR CIRCULATION: Both intracranial ICAs are patent. The right ICA is poorly opacified compared to the left due to findings detailed above. No significant stenosis of the anterior cerebral arteries. No significant stenosis of the middle cerebral arteries. No aneurysm. POSTERIOR CIRCULATION: No significant stenosis of the posterior cerebral arteries. No significant stenosis of the basilar artery. Moderate right and severe left vertebral artery origin stenosis. No aneurysm. OTHER: No dural venous sinus thrombosis on this non-dedicated study. OTHER: Dr. Vinnie paged at the time of dictation for call of report. Will addend when reached. IMPRESSION: 1. 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 The more distal right ICA is patent but poorly opacified when compared to the left. 2. Moderate right and severe left vertebral artery origin stenosis. 3. Edema and gas in the right neck, probably related to a recent cervical spine surgery. Infection is not excluded. Electronically signed by: Glendia Molt MD 03/17/2024 07:40 PM EST RP Workstation: [id number]   CT HEAD CODE STROKE WO CONTRAST Result Date: 03/17/2024 EXAM: CT HEAD WITHOUT CONTRAST 03/17/2024 07:04:55 PM TECHNIQUE: CT of the head was performed without the administration of intravenous contrast. Automated exposure control, iterative  reconstruction, and/or weight based adjustment of the mA/kV was utilized to reduce the radiation dose to as low as reasonably achievable. COMPARISON: None available. CLINICAL HISTORY: Neuro deficit, acute, stroke suspected. Acute neurological deficit; stroke suspected. FINDINGS: BRAIN AND VENTRICLES: Areas of hypoattenuation with loss of gray-white differentiation are present in the posterior right MCA territory, suggestive of  completed infarct. These areas of infarct involve primarily the right parietal lobe with involvement of the right postcentral gyrus noted. An additional area of infarct is present more inferiorly within the right parietal lobe. Infarct also extends into the region of the posterior aspect of the superior temporal gyrus. There is likely a small focus of involvement at the posterior aspect of the insular cortex. Edema within the region of infarct with sulcal effacement. No acute intracranial hemorrhage. No midline shift. No hydrocephalus. No extra-axial collection. Atherosclerosis of the carotid siphons. Left lens replacement. [place] Stroke Program Early CT (ASPECT) Score: Ganglionic (caudate, IC, lentiform nucleus, insula, M1-M3): 4 Supraganglionic (M4-M6): 2 Total: 6 SINUSES: No acute abnormality. SOFT TISSUES AND SKULL: No acute soft tissue abnormality. No skull fracture. IMPRESSION: 1. Completed acute infarct in the posterior right MCA territory involving the right parietal lobe including the postcentral gyrus partially extending into the posterior temporal lobe and suspect involvement at the posterior insular cortex. 2. Associated edema and sulcal effacement without midline shift. 3. No acute intracranial hemorrhage. 4. ASPECTS of 6. 5. Findings messaged to Dr. Rockney via the [facility] messaging system at 6:13 PM on 03/17/24. Electronically signed by: Donnice Mania MD 03/17/2024 07:18 PM EST RP Workstation: [id number]     .Critical Care  Performed by: Randol Simmonds, MD Authorized by:  Randol Simmonds, MD   Critical care provider statement:    Critical care time (minutes):  30   Critical care was time spent personally by me on the following activities:  Development of treatment plan with patient or surrogate, discussions with consultants, evaluation of patient's response to treatment, examination of patient, ordering and review of laboratory studies, ordering and review of radiographic studies, ordering and performing treatments and interventions, pulse oximetry, re-evaluation of patient's condition and review of old charts    Medications Ordered in the ED  sodium chloride  flush (NS) 0.9 % injection 3 mL (has no administration in time range)  iohexol  (OMNIPAQUE ) 350 MG/ML injection 75 mL (75 mLs Intravenous Contrast Given 03/17/24 1914)    Clinical Course as of 03/17/24 2008  Fri Mar 17, 2024  1944 CBC(!) White blood cell count slightly increased compared to previous [JK]  1945 I-stat chem 8, ED(!) Creatinine is elevated but similar to previous values [JK]  1945 CT HEAD CODE STROKE WO CONTRAST Head CT shows an infarct in the right MCA [JK]  1945 Patient has evidence of carotid artery occlusion and intraluminal thrombus on the CT [JK]  1955 Case discussed with Dr Rockney.  No indication for heparin  with his recent stroke.  Pt will be admitted to neuro icu for close monitoring [JK]    Clinical Course User Index [JK] Randol Simmonds, MD                                 Medical Decision Making Problems Addressed: Cerebrovascular accident (CVA) due to occlusion of right middle cerebral artery [facility]): acute illness or injury that poses a threat to life or bodily functions  Amount and/or Complexity of Data Reviewed Labs: ordered. Decision-making details documented in ED Course. Radiology: ordered and independent interpretation performed. Decision-making details documented in ED Course.  Risk Decision regarding hospitalization.   Patient presented with symptoms concerning for  an acute stroke.  Patient unfortunately is not a TNK candidate considering his recent spinal surgery just 2 days ago.  CT scan confirms acute stroke, ct angio shows vascular occlusion.  Pt seen  by stroke team on arrival including Dr Rockney.  Pt not a candidate for TNK, vascular intervention.  Will plan on admission to the hospital for further stroke workup      Final diagnoses:  Cerebrovascular accident (CVA) due to occlusion of right middle cerebral artery [facility])    ED Discharge Orders     None          Randol Simmonds, MD 03/17/24 2009  "

## 2024-03-17 NOTE — Code Documentation (Signed)
 Responded to Code Stroke called at 1826 for L facial droop, L sided weakness, slurred speech, and extinction, [id number]. Pt arrived at 1852, CBG-181, NIH-8, CT head negative for acute changes. CTA-occluded R ICA at its origin, more distal R ICA patent but poorly opacified when compared to L. TNK not given-recent surgery. Pt not a candidate for IR intervention. Plan ICU admission. Please complete VS/neuro checks(including NIH) q2h x 12h, then q4h(or more often if unit protocol).

## 2024-03-17 NOTE — Progress Notes (Signed)
 PHARMACY - ANTICOAGULATION CONSULT NOTE  Pharmacy Consult for Heparin  Infusion Indication: stroke  Allergies[1]  Patient Measurements: Height: 5' 9 (175.3 cm) Weight: 85.4 kg (188 lb 4.4 oz) IBW/kg (Calculated) : 70.7 HEPARIN  DW (KG): 85.4  Vital Signs: Temp: 98.1 F (36.7 C) (02/06 2025) Temp Source: Oral (02/06 2025) BP: 130/83 (02/06 1932) Pulse Rate: 85 (02/06 1932)  Labs: Recent Labs    03/17/24 1910 03/17/24 1913  HGB 14.7 14.6  HCT 42.8 43.0  PLT 238  --   APTT 26  --   LABPROT 14.5  --   INR 1.1  --   CREATININE 1.87* 2.00*    Estimated Creatinine Clearance: 33 mL/min (A) (by C-G formula based on SCr of 2 mg/dL (H)).  Assessment: Pharmacy consulted for heparin  infusion for stroke. Low dose and no bolus protocol. Patient does not appear to be on anticoagulation at baseline.  Goal of Therapy:  Heparin  level 0.3-0.7 units/ml Monitor platelets by anticoagulation protocol: Yes   Plan:  Start heparin  infusion at 1000 units/hr Check anti-Xa level with AM labs and daily while on heparin  Continue to monitor H&H and platelets  Larraine Brazier, PharmD Clinical Pharmacist 03/17/2024  8:30 PM **Pharmacist phone directory can now be found on [email or web address] ([email or web address]).  Listed under [facility].       [1] No Known Allergies

## 2024-03-18 LAB — MRSA NEXT GEN BY PCR, NASAL: MRSA by PCR Next Gen: NOT DETECTED
